# Patient Record
Sex: Male | Born: 1950
Health system: Southern US, Community
[De-identification: ages and names within clinical notes are randomized; demographics above are authoritative.]

## PROBLEM LIST (undated history)

## (undated) DIAGNOSIS — N400 Enlarged prostate without lower urinary tract symptoms: Secondary | ICD-10-CM

## (undated) DIAGNOSIS — I2510 Atherosclerotic heart disease of native coronary artery without angina pectoris: Secondary | ICD-10-CM

## (undated) DIAGNOSIS — Z8489 Family history of other specified conditions: Secondary | ICD-10-CM

## (undated) DIAGNOSIS — R112 Nausea with vomiting, unspecified: Secondary | ICD-10-CM

## (undated) DIAGNOSIS — E119 Type 2 diabetes mellitus without complications: Secondary | ICD-10-CM

## (undated) DIAGNOSIS — N200 Calculus of kidney: Secondary | ICD-10-CM

## (undated) DIAGNOSIS — G4700 Insomnia, unspecified: Secondary | ICD-10-CM

## (undated) DIAGNOSIS — H409 Unspecified glaucoma: Secondary | ICD-10-CM

## (undated) DIAGNOSIS — E7800 Pure hypercholesterolemia, unspecified: Secondary | ICD-10-CM

## (undated) DIAGNOSIS — I700 Atherosclerosis of aorta: Secondary | ICD-10-CM

## (undated) DIAGNOSIS — Z87442 Personal history of urinary calculi: Secondary | ICD-10-CM

## (undated) HISTORY — PX: URETEROSCOPY: SHX842

## (undated) HISTORY — PX: MOHS SURGERY: SUR867

## (undated) HISTORY — DX: Family history of other specified conditions: Z84.89

## (undated) HISTORY — DX: Calculus of kidney: N20.0

## (undated) HISTORY — PX: OTHER SURGICAL HISTORY: SHX169

## (undated) HISTORY — DX: Insomnia, unspecified: G47.00

## (undated) HISTORY — DX: Unspecified glaucoma: H40.9

## (undated) HISTORY — DX: Other specified postprocedural states: R11.2

## (undated) HISTORY — DX: Personal history of urinary calculi: Z87.442

## (undated) HISTORY — DX: Atherosclerotic heart disease of native coronary artery without angina pectoris: I25.10

## (undated) HISTORY — DX: Pure hypercholesterolemia, unspecified: E78.00

## (undated) HISTORY — DX: Benign prostatic hyperplasia without lower urinary tract symptoms: N40.0

## (undated) HISTORY — DX: Atherosclerosis of aorta: I70.0

---

## 2010-05-06 ENCOUNTER — Ambulatory Visit: Payer: Self-pay | Admitting: Urology

## 2010-05-19 ENCOUNTER — Ambulatory Visit: Payer: Self-pay | Admitting: Urology

## 2010-05-31 ENCOUNTER — Ambulatory Visit: Payer: Self-pay | Admitting: Urology

## 2010-05-31 DIAGNOSIS — I498 Other specified cardiac arrhythmias: Secondary | ICD-10-CM

## 2010-06-02 ENCOUNTER — Ambulatory Visit: Payer: Self-pay | Admitting: Urology

## 2010-06-06 ENCOUNTER — Ambulatory Visit: Payer: Self-pay | Admitting: Urology

## 2010-06-17 ENCOUNTER — Ambulatory Visit: Payer: Self-pay | Admitting: Urology

## 2010-06-21 ENCOUNTER — Ambulatory Visit: Payer: Self-pay | Admitting: Urology

## 2010-08-02 ENCOUNTER — Ambulatory Visit: Payer: Self-pay | Admitting: Urology

## 2010-08-26 ENCOUNTER — Ambulatory Visit: Payer: Self-pay | Admitting: Urology

## 2011-01-13 ENCOUNTER — Ambulatory Visit: Payer: Self-pay | Admitting: Urology

## 2011-01-27 ENCOUNTER — Ambulatory Visit: Payer: Self-pay | Admitting: Urology

## 2011-03-18 IMAGING — CR DG ABDOMEN 1V
1 series · 2 of 2 positions shown · non-contrast
Comparison: none

REASON FOR EXAM: nephrolithisis post litho Pt need films
COMMENTS:

[Series 1: view not recorded · 0.17mm/px · 2 of 2 slices shown]
[im 1/2]
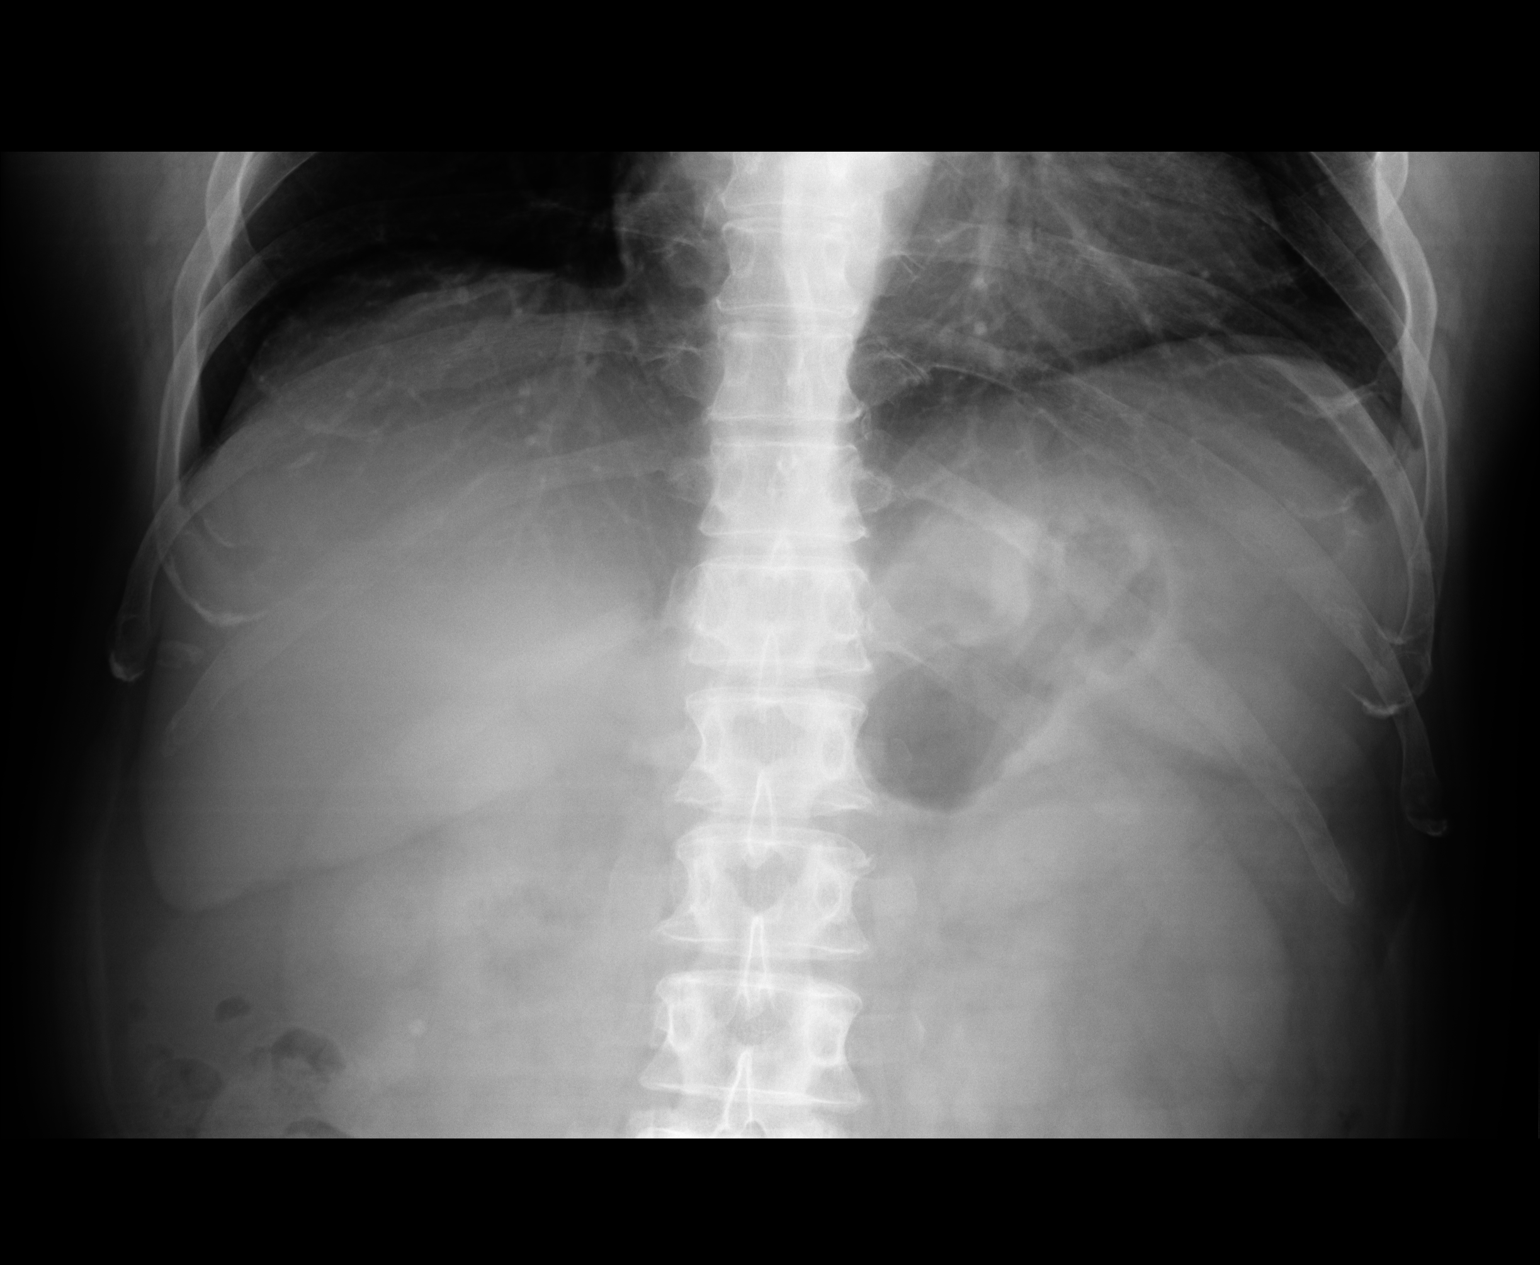
[im 2/2]
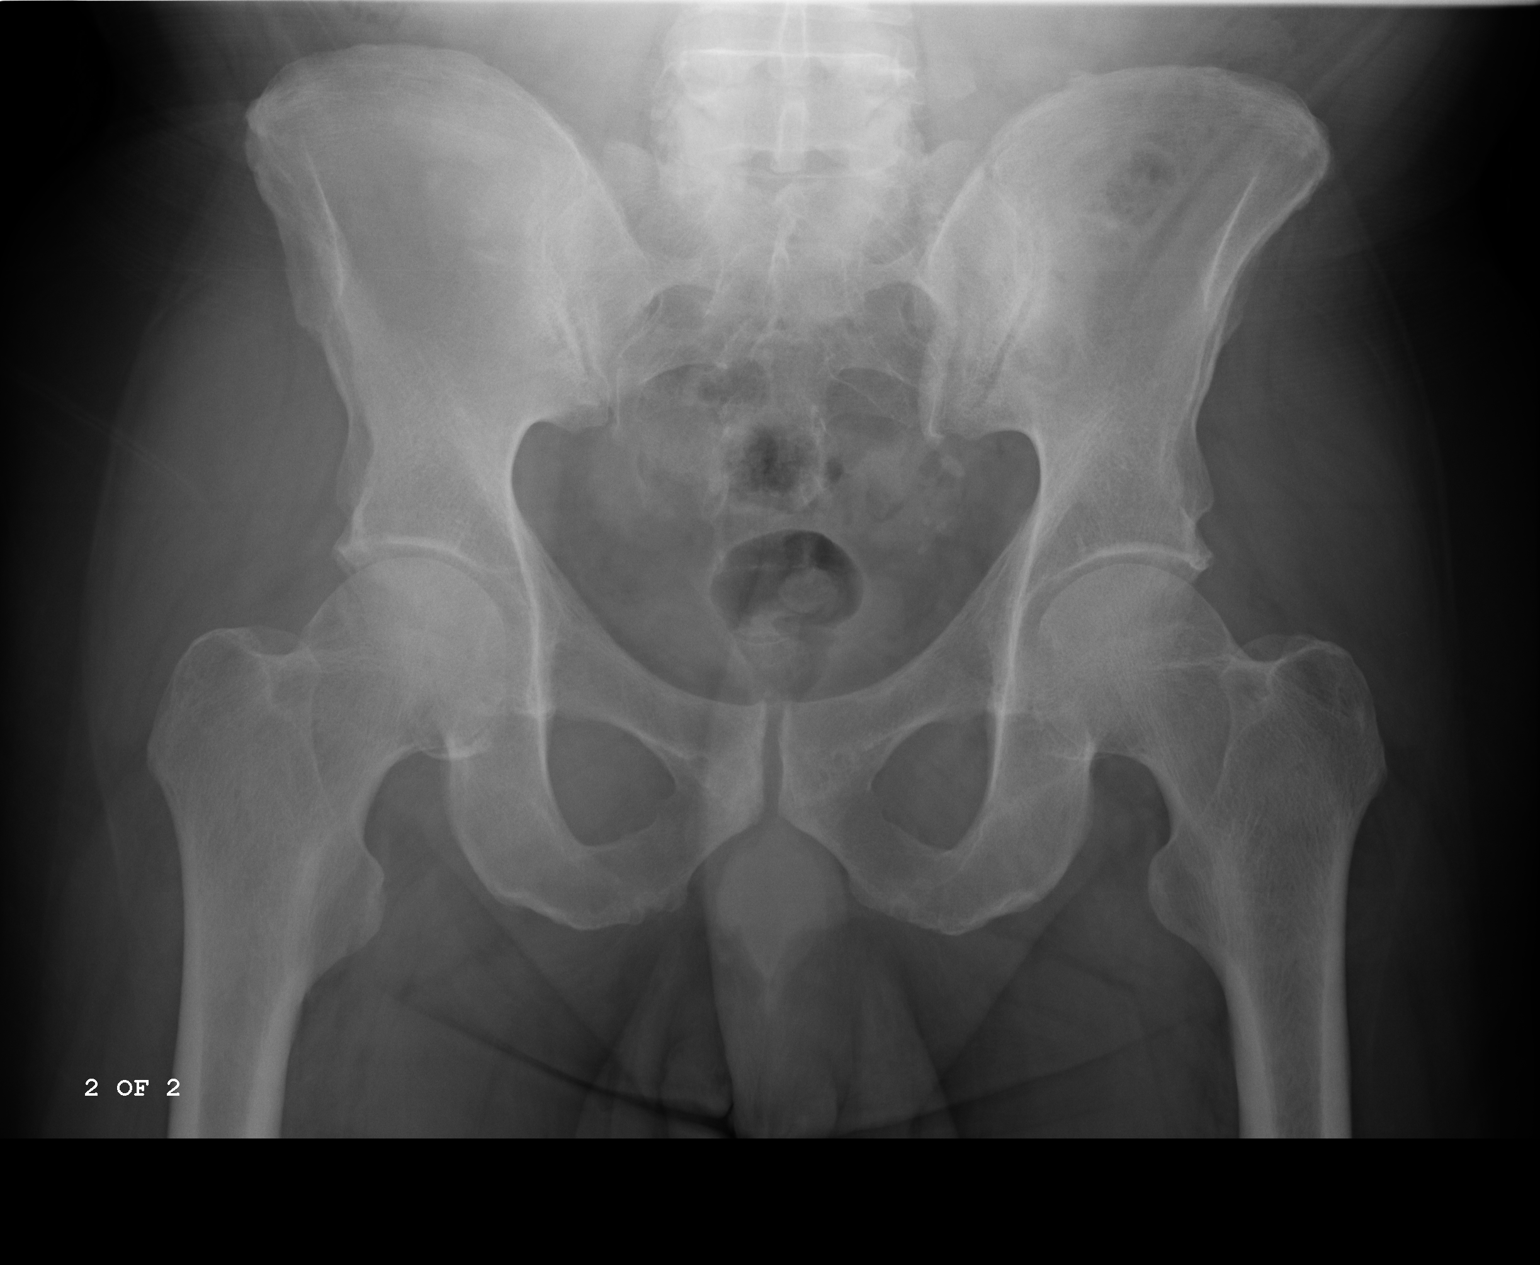

[2 of 2 positions shown; findings below may reference images not displayed]

PROCEDURE:     DXR - DXR KIDNEY URETER BLADDER  - June 17, 2010 [DATE]

RESULT:     There is increased density involving the distal left ureter
compatible with there being multiple stone fragments within. This density is
more inferior in location than was present on the exam of 06/06/2010. The
multiple calcifications at the lower pole of the left kidney noted on the
prior exam are no longer seen. There is again noted a 4 mm calcification at
the lower pole of the right kidney.
IMPRESSION: 1.  Left ureterolithiasis.
2.  Right nephrolithiasis.

## 2011-07-07 ENCOUNTER — Ambulatory Visit: Payer: Self-pay | Admitting: Urology

## 2012-08-16 ENCOUNTER — Ambulatory Visit: Payer: Self-pay | Admitting: Urology

## 2013-04-17 HISTORY — PX: EYE SURGERY: SHX253

## 2013-09-03 ENCOUNTER — Ambulatory Visit: Payer: Self-pay | Admitting: Urology

## 2014-01-15 ENCOUNTER — Ambulatory Visit: Payer: Self-pay | Admitting: Ophthalmology

## 2014-01-20 ENCOUNTER — Ambulatory Visit: Payer: Self-pay | Admitting: Ophthalmology

## 2014-02-05 ENCOUNTER — Ambulatory Visit: Payer: Self-pay | Admitting: Ophthalmology

## 2014-08-08 NOTE — Op Note (Signed)
PATIENT NAME:  Leonard Trujillo, Leonard Trujillo MR#:  [id number] DATE OF BIRTH:  10-Apr-1951  DATE OF PROCEDURE:  01/20/2014  PREOPERATIVE DIAGNOSIS: Visually significant cataract of the left eye.   POSTOPERATIVE DIAGNOSIS: Visually significant cataract of the left eye.   OPERATIVE PROCEDURE: Cataract extraction by phacoemulsification with implant of intraocular lens to left eye.   SURGEON: Birder Robson, MD.   ANESTHESIA:  1. Managed anesthesia care.  2. Topical tetracaine drops followed by 2% Xylocaine jelly applied in the preoperative holding area.   COMPLICATIONS: None.   TECHNIQUE:  Stop and chop.   DESCRIPTION OF PROCEDURE: The patient was examined and consented in the preoperative holding area where the aforementioned topical anesthesia was applied to the left eye and then brought back to the Operating Room where the left eye was prepped and draped in the usual sterile ophthalmic fashion and a lid speculum was placed. A paracentesis was created with the side port blade and the anterior chamber was filled with viscoelastic. A near clear corneal incision was performed with the steel keratome. A continuous curvilinear capsulorrhexis was performed with a cystotome followed by the capsulorrhexis forceps. Hydrodissection and hydrodelineation were carried out with BSS on a blunt cannula. The lens was removed in a stop and chop technique and the remaining cortical material was removed with the irrigation-aspiration handpiece. The capsular bag was inflated with viscoelastic and the Tecnis ZCB00   12.5-diopter lens, serial number [id number] was placed in the capsular bag without complication. The remaining viscoelastic was removed from the eye with the irrigation-aspiration handpiece. The wounds were hydrated. The anterior chamber was flushed with Miostat and the eye was inflated to physiologic pressure. 0.1 mL of cefuroxime concentration 10 mg/mL was placed in the anterior chamber. The wounds were found to be water  tight. The eye was dressed with Vigamox. The patient was given protective glasses to wear throughout the day and a shield with which to sleep tonight. The patient was also given drops with which to begin a drop regimen today and will follow-up with me in one day.    ____________________________ Livingston Diones. Ferdinand Revoir, MD wlp:JT D: 01/20/2014 19:21:49 ET T: 01/20/2014 20:17:39 ET JOB#: [id number]  cc: Beverlyn Mcginness L. Rondrick Barreira, MD, <Dictator> Livingston Diones Alexie Samson MD ELECTRONICALLY SIGNED 01/21/2014 12:12

## 2015-04-18 HISTORY — PX: OTHER SURGICAL HISTORY: SHX169

## 2015-11-05 DIAGNOSIS — N2889 Other specified disorders of kidney and ureter: Secondary | ICD-10-CM | POA: Diagnosis not present

## 2015-11-05 DIAGNOSIS — N401 Enlarged prostate with lower urinary tract symptoms: Secondary | ICD-10-CM | POA: Diagnosis not present

## 2015-11-05 DIAGNOSIS — N138 Other obstructive and reflux uropathy: Secondary | ICD-10-CM | POA: Diagnosis not present

## 2015-11-05 DIAGNOSIS — R339 Retention of urine, unspecified: Secondary | ICD-10-CM | POA: Diagnosis not present

## 2015-11-05 DIAGNOSIS — Z6824 Body mass index (BMI) 24.0-24.9, adult: Secondary | ICD-10-CM | POA: Diagnosis not present

## 2015-11-05 DIAGNOSIS — R972 Elevated prostate specific antigen [PSA]: Secondary | ICD-10-CM | POA: Diagnosis not present

## 2015-11-05 DIAGNOSIS — N200 Calculus of kidney: Secondary | ICD-10-CM | POA: Diagnosis not present

## 2015-11-10 DIAGNOSIS — E119 Type 2 diabetes mellitus without complications: Secondary | ICD-10-CM | POA: Diagnosis not present

## 2015-12-02 DIAGNOSIS — N200 Calculus of kidney: Secondary | ICD-10-CM | POA: Diagnosis not present

## 2015-12-02 DIAGNOSIS — R9431 Abnormal electrocardiogram [ECG] [EKG]: Secondary | ICD-10-CM | POA: Diagnosis not present

## 2015-12-02 DIAGNOSIS — N138 Other obstructive and reflux uropathy: Secondary | ICD-10-CM | POA: Diagnosis not present

## 2015-12-02 DIAGNOSIS — N401 Enlarged prostate with lower urinary tract symptoms: Secondary | ICD-10-CM | POA: Diagnosis not present

## 2015-12-02 DIAGNOSIS — N186 End stage renal disease: Secondary | ICD-10-CM | POA: Diagnosis not present

## 2015-12-02 DIAGNOSIS — Z6824 Body mass index (BMI) 24.0-24.9, adult: Secondary | ICD-10-CM | POA: Diagnosis not present

## 2015-12-08 DIAGNOSIS — N2889 Other specified disorders of kidney and ureter: Secondary | ICD-10-CM | POA: Diagnosis not present

## 2015-12-08 DIAGNOSIS — N200 Calculus of kidney: Secondary | ICD-10-CM | POA: Diagnosis not present

## 2015-12-08 DIAGNOSIS — I878 Other specified disorders of veins: Secondary | ICD-10-CM | POA: Diagnosis not present

## 2015-12-23 DIAGNOSIS — N200 Calculus of kidney: Secondary | ICD-10-CM | POA: Diagnosis not present

## 2016-05-09 DIAGNOSIS — H401132 Primary open-angle glaucoma, bilateral, moderate stage: Secondary | ICD-10-CM | POA: Diagnosis not present

## 2016-05-10 DIAGNOSIS — Z6824 Body mass index (BMI) 24.0-24.9, adult: Secondary | ICD-10-CM | POA: Diagnosis not present

## 2016-05-10 DIAGNOSIS — R339 Retention of urine, unspecified: Secondary | ICD-10-CM | POA: Diagnosis not present

## 2016-05-10 DIAGNOSIS — N138 Other obstructive and reflux uropathy: Secondary | ICD-10-CM | POA: Diagnosis not present

## 2016-05-10 DIAGNOSIS — R972 Elevated prostate specific antigen [PSA]: Secondary | ICD-10-CM | POA: Diagnosis not present

## 2016-05-10 DIAGNOSIS — N401 Enlarged prostate with lower urinary tract symptoms: Secondary | ICD-10-CM | POA: Diagnosis not present

## 2016-05-10 DIAGNOSIS — N200 Calculus of kidney: Secondary | ICD-10-CM | POA: Diagnosis not present

## 2016-09-12 DIAGNOSIS — H401132 Primary open-angle glaucoma, bilateral, moderate stage: Secondary | ICD-10-CM | POA: Diagnosis not present

## 2016-11-03 DIAGNOSIS — Z6824 Body mass index (BMI) 24.0-24.9, adult: Secondary | ICD-10-CM | POA: Diagnosis not present

## 2016-11-03 DIAGNOSIS — N138 Other obstructive and reflux uropathy: Secondary | ICD-10-CM | POA: Diagnosis not present

## 2016-11-03 DIAGNOSIS — R339 Retention of urine, unspecified: Secondary | ICD-10-CM | POA: Diagnosis not present

## 2016-11-03 DIAGNOSIS — R972 Elevated prostate specific antigen [PSA]: Secondary | ICD-10-CM | POA: Diagnosis not present

## 2016-11-03 DIAGNOSIS — N401 Enlarged prostate with lower urinary tract symptoms: Secondary | ICD-10-CM | POA: Diagnosis not present

## 2016-11-03 DIAGNOSIS — N200 Calculus of kidney: Secondary | ICD-10-CM | POA: Diagnosis not present

## 2017-05-07 DIAGNOSIS — H401132 Primary open-angle glaucoma, bilateral, moderate stage: Secondary | ICD-10-CM | POA: Diagnosis not present

## 2017-05-14 DIAGNOSIS — H1859 Other hereditary corneal dystrophies: Secondary | ICD-10-CM | POA: Diagnosis not present

## 2017-08-30 ENCOUNTER — Telehealth: Payer: Self-pay | Admitting: Urology

## 2017-08-30 DIAGNOSIS — N138 Other obstructive and reflux uropathy: Secondary | ICD-10-CM

## 2017-08-30 DIAGNOSIS — Z87442 Personal history of urinary calculi: Secondary | ICD-10-CM

## 2017-08-30 DIAGNOSIS — N401 Enlarged prostate with lower urinary tract symptoms: Secondary | ICD-10-CM

## 2017-08-30 NOTE — Telephone Encounter (Signed)
Patient is transferring care from [facility] from [place] and he stated that he normally gets a KUB and PSA prior to the appointment. I have sched him can you put an order in for labs and a KUB please.  Thanks you  [employer]

## 2017-08-31 NOTE — Telephone Encounter (Signed)
Orders were entered.

## 2017-11-06 ENCOUNTER — Ambulatory Visit
Admission: RE | Admit: 2017-11-06 | Discharge: 2017-11-06 | Disposition: A | Discharge status: Home / Self Care | Payer: Medicare HMO | Source: Ambulatory Visit | Attending: Urology | Admitting: Urology

## 2017-11-06 ENCOUNTER — Other Ambulatory Visit: Payer: Medicare HMO

## 2017-11-06 DIAGNOSIS — N200 Calculus of kidney: Secondary | ICD-10-CM | POA: Insufficient documentation

## 2017-11-06 DIAGNOSIS — Z87442 Personal history of urinary calculi: Secondary | ICD-10-CM | POA: Diagnosis present

## 2017-11-06 DIAGNOSIS — R972 Elevated prostate specific antigen [PSA]: Secondary | ICD-10-CM | POA: Diagnosis not present

## 2017-11-07 LAB — PSA: PROSTATE SPECIFIC AG, SERUM: 2.1 ng/mL (ref 0.0–4.0)

## 2017-11-08 ENCOUNTER — Encounter: Payer: Self-pay | Admitting: Urology

## 2017-11-08 ENCOUNTER — Ambulatory Visit: Payer: Medicare HMO | Admitting: Urology

## 2017-11-08 VITALS — BP 170/80 | HR 71 | Resp 16 | Ht 69.0 in | Wt 180.3 lb

## 2017-11-08 DIAGNOSIS — Z87442 Personal history of urinary calculi: Secondary | ICD-10-CM

## 2017-11-08 LAB — URINALYSIS, COMPLETE
Bilirubin, UA: NEGATIVE
LEUKOCYTES UA: NEGATIVE
Nitrite, UA: NEGATIVE
PROTEIN UA: NEGATIVE
RBC, UA: NEGATIVE
Specific Gravity, UA: 1.025 (ref 1.005–1.030)
Urobilinogen, Ur: 0.2 mg/dL (ref 0.2–1.0)
pH, UA: 5 (ref 5.0–7.5)

## 2017-11-08 LAB — MICROSCOPIC EXAMINATION
Epithelial Cells (non renal): NONE SEEN /hpf (ref 0–10)
WBC, UA: NONE SEEN /hpf (ref 0–5)

## 2017-11-08 NOTE — Progress Notes (Signed)
11/08/2017 10:23 AM   Leonard Trujillo 1951/04/01 [id number]  Referring provider: No referring provider defined for this encounter.  Chief Complaint  Patient presents with  . Nephrolithiasis    HPI: 67 year old male presents to establish local urologic care.  He has previously been followed by Dr. Jacqlyn Larsen in both [place] and at [facility] For recurrent stone disease and BPH.  He has had previous shockwave lithotripsy and ureteroscopic stone removal.  He developed urinary retention after ureteroscopy and 2012 and underwent PVP.  He last saw Dr. Jacqlyn Larsen in July 2018.  He was asymptomatic and KUB at that visit was unremarkable.  He denies bothersome lower urinary tract symptoms.  PSA performed earlier this month was stable at 2.1.  He denies dysuria or gross hematuria.  He has no flank, abdominal, pelvic or scrotal pain.  He is on tamsulosin and potassium citrate.  PMH: History reviewed. No pertinent past medical history.  Surgical History: . CATARACT EXTRACTION, BILATERAL october 2015  . CYSTOSCOPY W/ URETERAL STENT PLACEMENT  . EXTRACORPOREAL SHOCK WAVE LITHOTRIPSY  05/2010  . TRANSURETHRAL RESECTION OF PROSTATE  KTP laser 07/2010  . URETEROSCOPY  w/Holmium 3/12    Home Medications:  Allergies as of 11/08/2017      Reactions   Codeine Nausea And Vomiting      Medication List        Accurate as of 11/08/17 10:23 AM. Always use your most recent med list.          acetaminophen 325 MG tablet Commonly known as:  TYLENOL Take by mouth.   ALPRAZolam 0.5 MG tablet Commonly known as:  XANAX nightly as needed.   dorzolamide-timolol 22.3-6.8 MG/ML ophthalmic solution Commonly known as:  COSOPT Administer 1 drop to both eyes Two (2) times a day.   HYDROcodone-acetaminophen 5-325 MG tablet Commonly known as:  NORCO/VICODIN Take by mouth.   LUMIGAN 0.01 % Soln Generic drug:  bimatoprost 1 drop nightly.   metFORMIN 500 MG tablet Commonly known as:  GLUCOPHAGE 250 mg.  250-500mg    ondansetron 4 MG tablet Commonly known as:  ZOFRAN Take by mouth.   tamsulosin 0.4 MG Caps capsule Commonly known as:  FLOMAX Take by mouth.       Allergies:  Allergies  Allergen Reactions  . Codeine Nausea And Vomiting    Family History: History reviewed. No pertinent family history.  Social History:  has no tobacco, alcohol, and drug history on file.  ROS: UROLOGY Frequent Urination?: No Hard to postpone urination?: No Burning/pain with urination?: No Get up at night to urinate?: No Leakage of urine?: No Urine stream starts and stops?: No Trouble starting stream?: No Do you have to strain to urinate?: No Blood in urine?: No Urinary tract infection?: No Sexually transmitted disease?: No Injury to kidneys or bladder?: No Painful intercourse?: No Weak stream?: No Erection problems?: No Penile pain?: No  Gastrointestinal Nausea?: No Vomiting?: No Indigestion/heartburn?: No Diarrhea?: No Constipation?: No  Constitutional Fever: No Night sweats?: No Weight loss?: No Fatigue?: No  Skin Skin rash/lesions?: No Itching?: No  Eyes Blurred vision?: No Double vision?: No  Ears/Nose/Throat Sore throat?: No Sinus problems?: No  Hematologic/Lymphatic Swollen glands?: No Easy bruising?: No  Cardiovascular Leg swelling?: No Chest pain?: No  Respiratory Cough?: No Shortness of breath?: No  Endocrine Excessive thirst?: No  Musculoskeletal Back pain?: No Joint pain?: No  Neurological Headaches?: No Dizziness?: No  Psychologic Depression?: No Anxiety?: Yes  Physical Exam: BP (!) 170/80   Pulse 71  Resp 16   Ht 5\' 9"  (1.753 m)   Wt 180 lb 4.8 oz (81.8 kg)   SpO2 96%   BMI 26.63 kg/m   Constitutional:  Alert and oriented, No acute distress. HEENT: [place] AT, moist mucus membranes.  Trachea midline, no masses. Cardiovascular: No clubbing, cyanosis, or edema. Respiratory: Normal respiratory effort, no increased work of  breathing. GI: Abdomen is soft, nontender, nondistended, no abdominal masses GU: No CVA tenderness.  Prostate 50 g, smooth without nodules. Lymph: No cervical or inguinal lymphadenopathy. Skin: No rashes, bruises or suspicious lesions. Neurologic: Grossly intact, no focal deficits, moving all 4 extremities. Psychiatric: Normal mood and affect.   Pertinent Imaging: KUB was personally reviewed. Results for orders placed during the hospital encounter of 11/06/17  Abdomen 1 view (KUB)   Narrative CLINICAL DATA:  Nephrolithiasis  EXAM: ABDOMEN - 1 VIEW  COMPARISON:  Sep 08, 2013  FINDINGS: There is a calcification measuring 9 x 8 mm either in or overlying the upper to mid left kidney. There are nearby 1-2 mm calcifications, suspected upper pole left renal calculi. A previously noted 6 mm calcification in the lower pole left kidney is stable.  There are small vascular calcifications in the pelvis. There is moderate stool in the colon. There is no bowel obstruction or free air evident.  IMPRESSION: Stable calculus lower pole right kidney. Suspected calculi in the upper to mid left kidney, largest measuring 9 x 8 mm. Probable phleboliths in the pelvis. No bowel obstruction or free air.   Electronically Signed   By: Lowella Grip III M.D.   On: 11/06/2017 09:05     Assessment & Plan:   1.  Personal history nephrolithiasis KUB today does show faint calcifications overlying the renal outlines bilaterally.  A renal ultrasound was recommended for further evaluation.  They will be notified with results and further recommendations.  2.  BPH with lower urinary tract symptoms Stable voiding symptoms on tamsulosin.   Abbie Sons, [place] [address] [place],  [address] [phone]

## 2017-11-15 DIAGNOSIS — H401132 Primary open-angle glaucoma, bilateral, moderate stage: Secondary | ICD-10-CM | POA: Diagnosis not present

## 2017-11-16 ENCOUNTER — Ambulatory Visit
Admission: RE | Admit: 2017-11-16 | Discharge: 2017-11-16 | Disposition: A | Discharge status: Home / Self Care | Payer: Medicare HMO | Source: Ambulatory Visit | Attending: Urology | Admitting: Urology

## 2017-11-16 DIAGNOSIS — N1330 Unspecified hydronephrosis: Secondary | ICD-10-CM | POA: Insufficient documentation

## 2017-11-16 DIAGNOSIS — Z87442 Personal history of urinary calculi: Secondary | ICD-10-CM | POA: Diagnosis not present

## 2017-11-16 DIAGNOSIS — N1339 Other hydronephrosis: Secondary | ICD-10-CM | POA: Diagnosis not present

## 2017-11-18 ENCOUNTER — Other Ambulatory Visit: Payer: Self-pay | Admitting: Urology

## 2017-11-18 DIAGNOSIS — N1330 Unspecified hydronephrosis: Secondary | ICD-10-CM

## 2017-11-19 ENCOUNTER — Telehealth: Payer: Self-pay

## 2017-11-19 NOTE — Telephone Encounter (Signed)
Pt informed, please set up CT scan

## 2017-11-19 NOTE — Telephone Encounter (Signed)
Left message on vm

## 2017-11-19 NOTE — Telephone Encounter (Signed)
-----   Message from Abbie Sons, MD sent at 11/18/2017  9:09 AM EDT ----- Renal ultrasound showed mild left hydronephrosis but no calculi.  Recommend scheduling CT to evaluate for obstruction.

## 2017-11-22 DIAGNOSIS — Z125 Encounter for screening for malignant neoplasm of prostate: Secondary | ICD-10-CM | POA: Diagnosis not present

## 2017-11-22 DIAGNOSIS — E7800 Pure hypercholesterolemia, unspecified: Secondary | ICD-10-CM | POA: Diagnosis not present

## 2017-11-23 ENCOUNTER — Telehealth: Payer: Self-pay | Admitting: Urology

## 2017-11-23 NOTE — Telephone Encounter (Signed)
Spoke with pt, clarified questions about why having ct scan.

## 2017-11-23 NOTE — Telephone Encounter (Signed)
Pt is scheduled to have a CT on 11/27/17, needs instructions on what to do before his appointment and also some clarification on the procedure he is having done.

## 2017-11-27 ENCOUNTER — Ambulatory Visit
Admission: RE | Admit: 2017-11-27 | Discharge: 2017-11-27 | Disposition: A | Discharge status: Home / Self Care | Payer: Medicare HMO | Source: Ambulatory Visit | Attending: Urology | Admitting: Urology

## 2017-11-27 DIAGNOSIS — N132 Hydronephrosis with renal and ureteral calculous obstruction: Secondary | ICD-10-CM | POA: Insufficient documentation

## 2017-11-27 DIAGNOSIS — N200 Calculus of kidney: Secondary | ICD-10-CM | POA: Diagnosis not present

## 2017-11-27 DIAGNOSIS — N1330 Unspecified hydronephrosis: Secondary | ICD-10-CM | POA: Diagnosis present

## 2017-11-27 DIAGNOSIS — I700 Atherosclerosis of aorta: Secondary | ICD-10-CM | POA: Diagnosis not present

## 2017-11-27 DIAGNOSIS — K5730 Diverticulosis of large intestine without perforation or abscess without bleeding: Secondary | ICD-10-CM | POA: Insufficient documentation

## 2017-11-27 DIAGNOSIS — K4020 Bilateral inguinal hernia, without obstruction or gangrene, not specified as recurrent: Secondary | ICD-10-CM | POA: Diagnosis not present

## 2017-11-27 DIAGNOSIS — I2510 Atherosclerotic heart disease of native coronary artery without angina pectoris: Secondary | ICD-10-CM | POA: Diagnosis not present

## 2017-11-27 DIAGNOSIS — N400 Enlarged prostate without lower urinary tract symptoms: Secondary | ICD-10-CM | POA: Insufficient documentation

## 2017-11-27 LAB — POCT I-STAT CREATININE: Creatinine, Ser: 0.7 mg/dL (ref 0.61–1.24)

## 2017-11-27 MED ORDER — IOHEXOL 300 MG/ML  SOLN
100.0000 mL | Freq: Once | INTRAMUSCULAR | Status: AC | PRN
  Administered 2017-11-27: 100 mL via INTRAVENOUS

## 2017-12-03 DIAGNOSIS — Z0000 Encounter for general adult medical examination without abnormal findings: Secondary | ICD-10-CM | POA: Diagnosis not present

## 2017-12-03 DIAGNOSIS — Z125 Encounter for screening for malignant neoplasm of prostate: Secondary | ICD-10-CM | POA: Diagnosis not present

## 2017-12-03 DIAGNOSIS — R69 Illness, unspecified: Secondary | ICD-10-CM | POA: Diagnosis not present

## 2017-12-03 DIAGNOSIS — R739 Hyperglycemia, unspecified: Secondary | ICD-10-CM | POA: Diagnosis not present

## 2017-12-03 DIAGNOSIS — E7800 Pure hypercholesterolemia, unspecified: Secondary | ICD-10-CM | POA: Diagnosis not present

## 2018-03-05 DIAGNOSIS — R69 Illness, unspecified: Secondary | ICD-10-CM | POA: Diagnosis not present

## 2018-03-05 DIAGNOSIS — I700 Atherosclerosis of aorta: Secondary | ICD-10-CM | POA: Diagnosis not present

## 2018-03-05 DIAGNOSIS — E7800 Pure hypercholesterolemia, unspecified: Secondary | ICD-10-CM | POA: Diagnosis not present

## 2018-03-05 DIAGNOSIS — R739 Hyperglycemia, unspecified: Secondary | ICD-10-CM | POA: Diagnosis not present

## 2018-03-05 DIAGNOSIS — I2510 Atherosclerotic heart disease of native coronary artery without angina pectoris: Secondary | ICD-10-CM | POA: Diagnosis not present

## 2018-05-14 DIAGNOSIS — H401132 Primary open-angle glaucoma, bilateral, moderate stage: Secondary | ICD-10-CM | POA: Diagnosis not present

## 2018-05-20 DIAGNOSIS — E119 Type 2 diabetes mellitus without complications: Secondary | ICD-10-CM | POA: Diagnosis not present

## 2018-06-03 DIAGNOSIS — Z0100 Encounter for examination of eyes and vision without abnormal findings: Secondary | ICD-10-CM | POA: Diagnosis not present

## 2018-06-05 DIAGNOSIS — Z0000 Encounter for general adult medical examination without abnormal findings: Secondary | ICD-10-CM | POA: Diagnosis not present

## 2018-06-05 DIAGNOSIS — E7800 Pure hypercholesterolemia, unspecified: Secondary | ICD-10-CM | POA: Diagnosis not present

## 2018-06-05 DIAGNOSIS — R739 Hyperglycemia, unspecified: Secondary | ICD-10-CM | POA: Diagnosis not present

## 2018-06-12 DIAGNOSIS — I700 Atherosclerosis of aorta: Secondary | ICD-10-CM | POA: Diagnosis not present

## 2018-06-12 DIAGNOSIS — I2510 Atherosclerotic heart disease of native coronary artery without angina pectoris: Secondary | ICD-10-CM | POA: Diagnosis not present

## 2018-06-12 DIAGNOSIS — E7800 Pure hypercholesterolemia, unspecified: Secondary | ICD-10-CM | POA: Diagnosis not present

## 2018-06-12 DIAGNOSIS — R69 Illness, unspecified: Secondary | ICD-10-CM | POA: Diagnosis not present

## 2018-06-12 DIAGNOSIS — R739 Hyperglycemia, unspecified: Secondary | ICD-10-CM | POA: Diagnosis not present

## 2018-08-28 IMAGING — CT CT ABD-PEL WO/W CM
2 of 9 series · 13 of 46 positions shown, 15 images · IV contrast (omnipaque)
Comparison: Multiple exams, including abdominal ultrasound from
11/16/2017 and CT abdomen from 05/19/2010

CLINICAL DATA: Left hydronephrosis on ultrasound. History of renal
calculi. History of lithotripsy and renal stents.

EXAM:
CT ABDOMEN AND PELVIS WITHOUT AND WITH CONTRAST
TECHNIQUE: Multidetector CT imaging of the abdomen and pelvis was performed
following the standard protocol before and following the bolus
administration of intravenous contrast.
CONTRAST:  100mL OMNIPAQUE IOHEXOL 300 MG/ML  SOLN

[Series 2: axial st · axial · 0.80mm/px · z∈[-896,-500]mm · 10 of 97 slices shown, 12 images]
[im 9/97  soft-tissue]
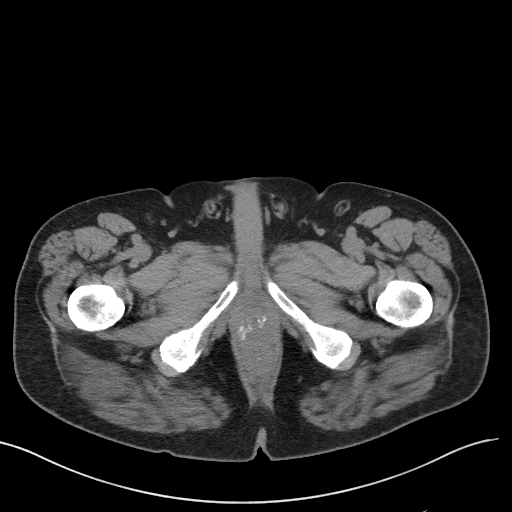
[im 9/97  bone]
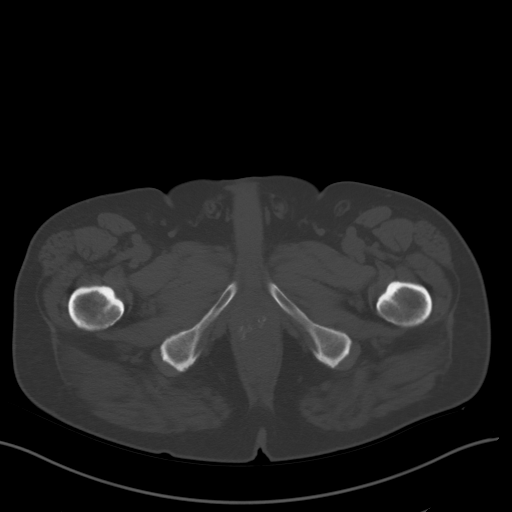
[im 18/97  soft-tissue]
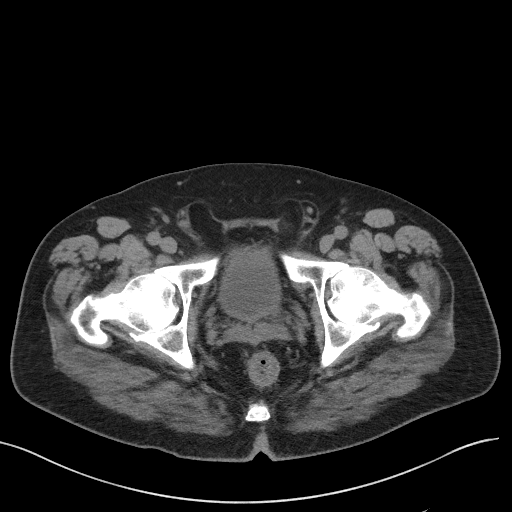
[im 27/97  soft-tissue]
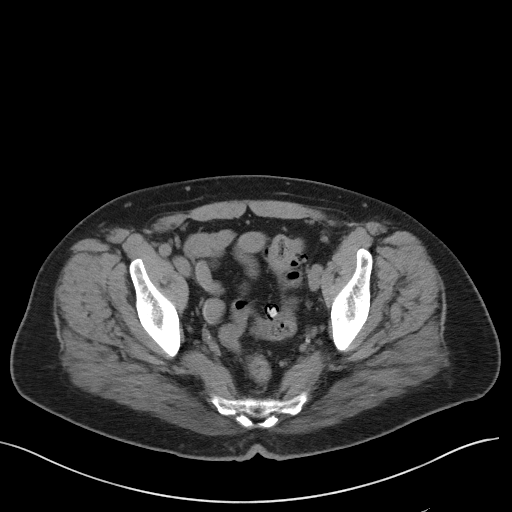
[im 35/97  soft-tissue]
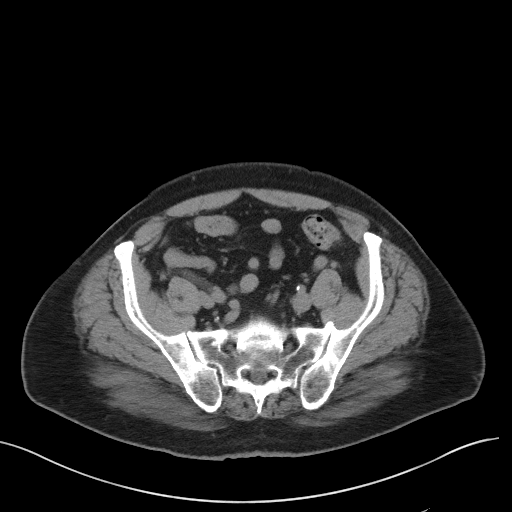
[im 44/97  soft-tissue]
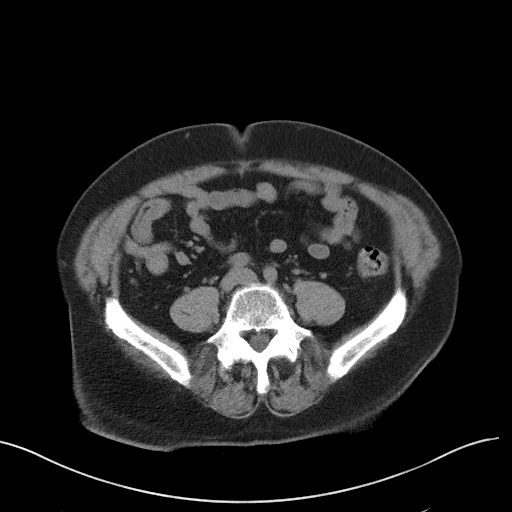
[im 53/97  soft-tissue]
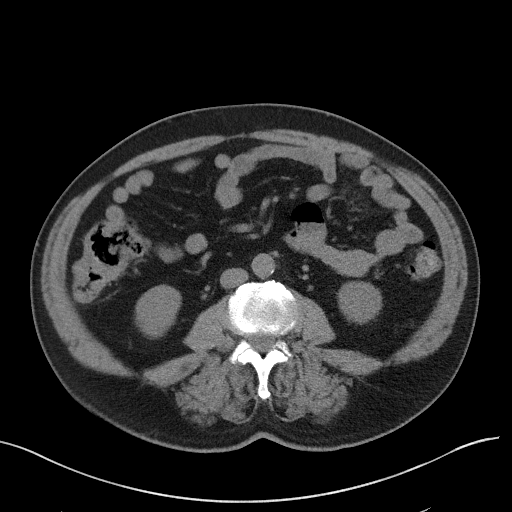
[im 62/97  soft-tissue]
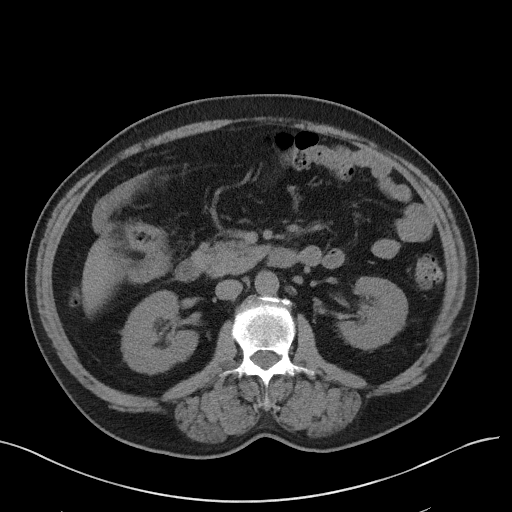
[im 70/97  soft-tissue]
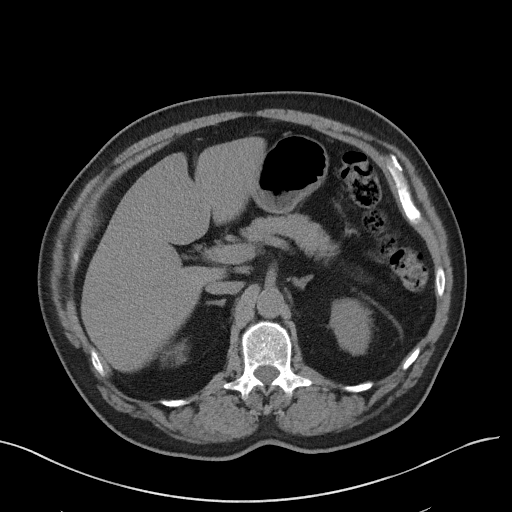
[im 79/97  soft-tissue]
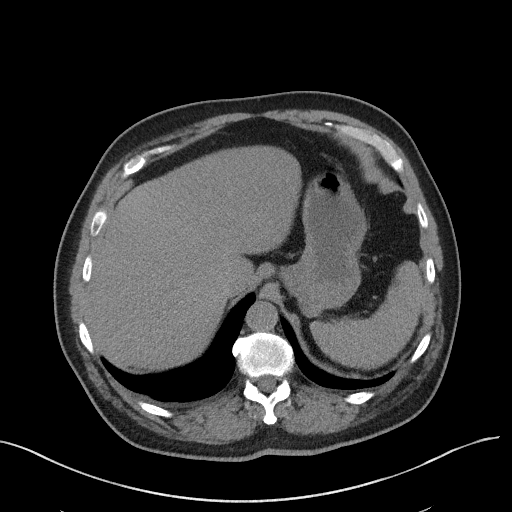
[im 79/97  bone]
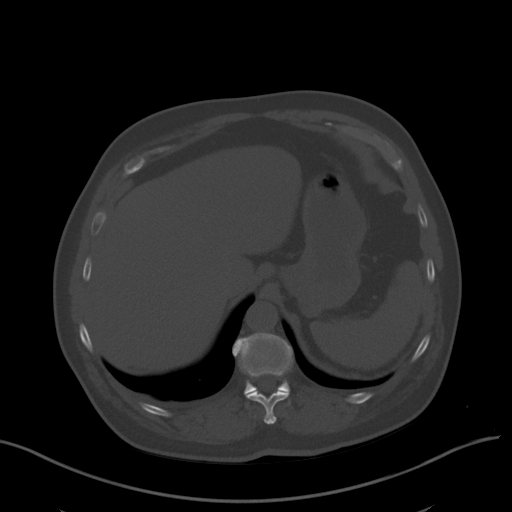
[im 88/97  soft-tissue]
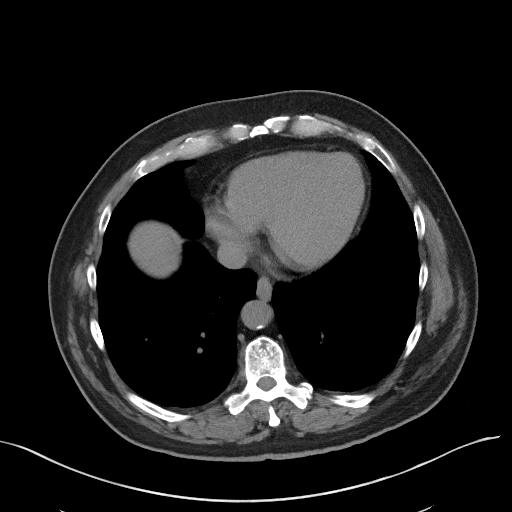

[Series 5: coronal st · coronal · 0.75mm/px · 3 of 101 slices shown]
[im 26/101  soft-tissue]
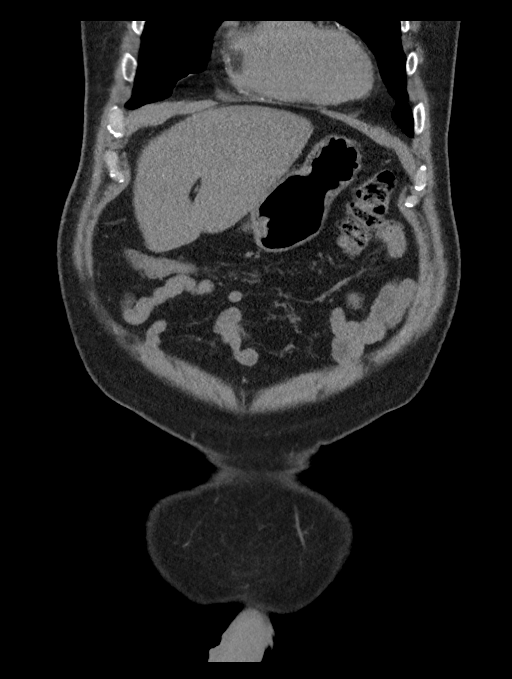
[im 51/101  soft-tissue]
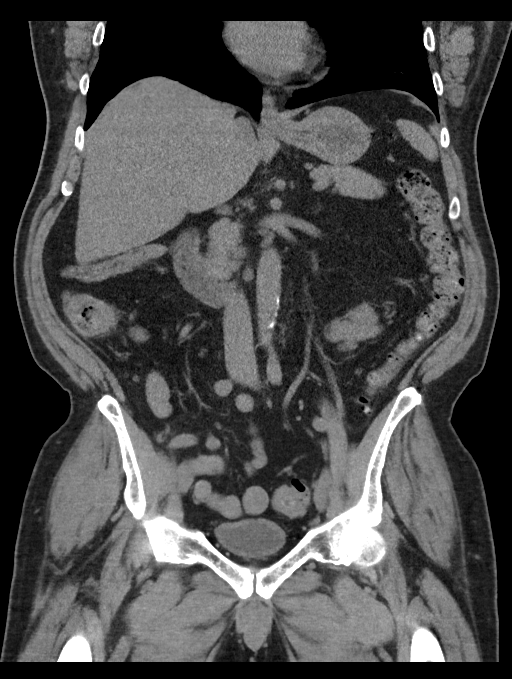
[im 76/101  soft-tissue]
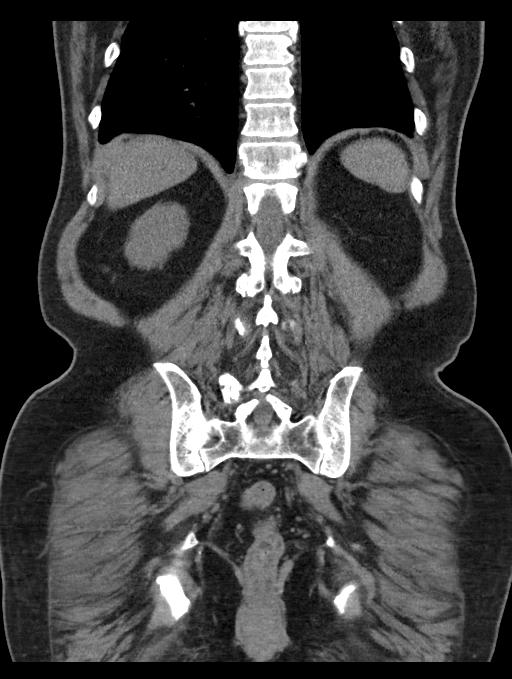

[13 of 46 positions shown; findings below may reference images not displayed]

FINDINGS: Lower chest: Three-vessel coronary artery atherosclerotic
calcification. Descending thoracic aortic atherosclerotic
calcification.

Hepatobiliary: Punctate calcification in the left hepatic lobe
favoring remote granulomatous process. Mildly contracted
gallbladder. Otherwise unremarkable.

Pancreas: Unremarkable

Spleen: Unremarkable

Adrenals/Urinary Tract: Both adrenal glands appear normal.

2 mm and adjacent 1 mm right kidney upper pole nonobstructive renal
calculi, image 73/5. 1 mm right kidney lower pole nonobstructive
renal calculus, image 65/5. 3 mm and adjacent 1 mm left mid upper
kidney nonobstructive renal calculus, image 66/5. 2 mm and separate
1 mm nonobstructive left kidney upper pole calculi, images 63-60
[DATE]. 1 mm left kidney lower pole nonobstructive renal calculus on
image 57/5. No current hydronephrosis or hydroureter. No ureteral
calculus. No significant abnormal renal parenchymal enhancement.
Urinary bladder unremarkable.

Stomach/Bowel: Several diverticula of the descending and sigmoid
colon without active diverticulitis.

Vascular/Lymphatic: Aortoiliac atherosclerotic vascular disease. No
pathologic adenopathy.

Reproductive: The prostate gland measures 4.7 by 4.2 by 4.6 cm
(volume = 48 cm^3).

Other: No supplemental non-categorized findings.

Musculoskeletal: Small bilateral direct inguinal hernias contain
adipose tissue. Central intervertebral spurring at L2-3.
IMPRESSION: 1. Bilateral nonobstructive renal calculi. No current hydronephrosis
or ureteral calculus.
2. Mild prostatomegaly.
3. Aortoiliac atherosclerotic vascular disease. Coronary
atherosclerosis.
4. Scant descending and sigmoid colon diverticula without findings
of inflammation.
5. Small bilateral direct inguinal hernias contain adipose tissue.

## 2018-11-18 DIAGNOSIS — H401132 Primary open-angle glaucoma, bilateral, moderate stage: Secondary | ICD-10-CM | POA: Diagnosis not present

## 2018-11-21 DIAGNOSIS — R7309 Other abnormal glucose: Secondary | ICD-10-CM | POA: Diagnosis not present

## 2018-11-21 DIAGNOSIS — E7800 Pure hypercholesterolemia, unspecified: Secondary | ICD-10-CM | POA: Diagnosis not present

## 2018-11-21 DIAGNOSIS — Z0000 Encounter for general adult medical examination without abnormal findings: Secondary | ICD-10-CM | POA: Diagnosis not present

## 2018-11-21 DIAGNOSIS — Z125 Encounter for screening for malignant neoplasm of prostate: Secondary | ICD-10-CM | POA: Diagnosis not present

## 2018-11-29 DIAGNOSIS — E7800 Pure hypercholesterolemia, unspecified: Secondary | ICD-10-CM | POA: Diagnosis not present

## 2018-11-29 DIAGNOSIS — R69 Illness, unspecified: Secondary | ICD-10-CM | POA: Diagnosis not present

## 2018-11-29 DIAGNOSIS — I2510 Atherosclerotic heart disease of native coronary artery without angina pectoris: Secondary | ICD-10-CM | POA: Diagnosis not present

## 2018-11-29 DIAGNOSIS — R739 Hyperglycemia, unspecified: Secondary | ICD-10-CM | POA: Diagnosis not present

## 2018-11-29 DIAGNOSIS — I700 Atherosclerosis of aorta: Secondary | ICD-10-CM | POA: Diagnosis not present

## 2019-02-25 DIAGNOSIS — E7800 Pure hypercholesterolemia, unspecified: Secondary | ICD-10-CM | POA: Diagnosis not present

## 2019-02-25 DIAGNOSIS — I2510 Atherosclerotic heart disease of native coronary artery without angina pectoris: Secondary | ICD-10-CM | POA: Diagnosis not present

## 2019-02-25 DIAGNOSIS — Z125 Encounter for screening for malignant neoplasm of prostate: Secondary | ICD-10-CM | POA: Diagnosis not present

## 2019-02-27 DIAGNOSIS — R69 Illness, unspecified: Secondary | ICD-10-CM | POA: Diagnosis not present

## 2019-03-04 DIAGNOSIS — R0989 Other specified symptoms and signs involving the circulatory and respiratory systems: Secondary | ICD-10-CM | POA: Diagnosis not present

## 2019-03-04 DIAGNOSIS — R739 Hyperglycemia, unspecified: Secondary | ICD-10-CM | POA: Diagnosis not present

## 2019-03-04 DIAGNOSIS — E7800 Pure hypercholesterolemia, unspecified: Secondary | ICD-10-CM | POA: Diagnosis not present

## 2019-03-04 DIAGNOSIS — Z0000 Encounter for general adult medical examination without abnormal findings: Secondary | ICD-10-CM | POA: Diagnosis not present

## 2019-03-05 ENCOUNTER — Other Ambulatory Visit: Payer: Self-pay

## 2019-03-12 DIAGNOSIS — R69 Illness, unspecified: Secondary | ICD-10-CM | POA: Diagnosis not present

## 2019-05-22 DIAGNOSIS — H401132 Primary open-angle glaucoma, bilateral, moderate stage: Secondary | ICD-10-CM | POA: Diagnosis not present

## 2019-05-29 DIAGNOSIS — E119 Type 2 diabetes mellitus without complications: Secondary | ICD-10-CM | POA: Diagnosis not present

## 2019-07-22 DIAGNOSIS — E7800 Pure hypercholesterolemia, unspecified: Secondary | ICD-10-CM | POA: Diagnosis not present

## 2019-07-22 DIAGNOSIS — R739 Hyperglycemia, unspecified: Secondary | ICD-10-CM | POA: Diagnosis not present

## 2019-07-29 DIAGNOSIS — I2510 Atherosclerotic heart disease of native coronary artery without angina pectoris: Secondary | ICD-10-CM | POA: Diagnosis not present

## 2019-07-29 DIAGNOSIS — E7800 Pure hypercholesterolemia, unspecified: Secondary | ICD-10-CM | POA: Diagnosis not present

## 2019-07-29 DIAGNOSIS — R69 Illness, unspecified: Secondary | ICD-10-CM | POA: Diagnosis not present

## 2019-07-29 DIAGNOSIS — R739 Hyperglycemia, unspecified: Secondary | ICD-10-CM | POA: Diagnosis not present

## 2019-08-20 DIAGNOSIS — R69 Illness, unspecified: Secondary | ICD-10-CM | POA: Diagnosis not present

## 2019-11-27 DIAGNOSIS — H26491 Other secondary cataract, right eye: Secondary | ICD-10-CM | POA: Diagnosis not present

## 2020-06-10 DIAGNOSIS — H4010X2 Unspecified open-angle glaucoma, moderate stage: Secondary | ICD-10-CM | POA: Diagnosis not present

## 2021-11-07 ENCOUNTER — Encounter: Payer: Self-pay | Admitting: *Deleted

## 2021-12-21 ENCOUNTER — Ambulatory Visit: Payer: Medicare HMO | Admitting: Urology

## 2021-12-28 ENCOUNTER — Encounter: Payer: Self-pay | Admitting: Urology

## 2021-12-28 ENCOUNTER — Ambulatory Visit: Payer: Medicare HMO | Admitting: Urology

## 2021-12-28 VITALS — BP 149/79 | HR 61 | Ht 69.0 in | Wt 150.0 lb

## 2021-12-28 DIAGNOSIS — R3129 Other microscopic hematuria: Secondary | ICD-10-CM | POA: Diagnosis not present

## 2021-12-28 DIAGNOSIS — N200 Calculus of kidney: Secondary | ICD-10-CM

## 2021-12-28 LAB — URINALYSIS, COMPLETE
Bilirubin, UA: NEGATIVE
Glucose, UA: NEGATIVE
Ketones, UA: NEGATIVE
Nitrite, UA: NEGATIVE
Specific Gravity, UA: 1.03 (ref 1.005–1.030)
Urobilinogen, Ur: 0.2 mg/dL (ref 0.2–1.0)
pH, UA: 5.5 (ref 5.0–7.5)

## 2021-12-28 LAB — MICROSCOPIC EXAMINATION

## 2021-12-28 NOTE — Progress Notes (Signed)
12/28/2021 9:18 AM   Leonard Trujillo 06-Jun-1950 [id number]  Referring provider: Adin Hector, MD [facility] [place],  [place] [address]  Chief Complaint  Patient presents with   Hematuria    HPI: 71 y.o. male referred for evaluation of gross hematuria.  Prior history of nephrolithiasis who I last saw in 2019. CT abdomen/pelvis August 2019 showed bilateral, nonobstructing renal calculi; 1-2 mm The referral was for gross hematuria however he denies gross hematuria and states recent urinalysis with microhematuria Denies flank, abdominal or pelvic pain. Urinalysis 10/25/2021 showed moderate blood on dipstick but no significant RBCs on microscopy; UA January 2023 with 4-10 RBCs and 04/2021 10-50 RBCs PSA was 2.01 No bothersome LUTS Denies flank, abdominal or pelvic pain   PMH: History reviewed. No pertinent past medical history.  Surgical History: Past Surgical History:  Procedure Laterality Date   Shockwave lithotripsy     URETEROSCOPY      Home Medications:  Allergies as of 12/28/2021       Reactions   Codeine Nausea And Vomiting        Medication List        Accurate as of December 28, 2021  9:18 AM. If you have any questions, ask your nurse or doctor.          STOP taking these medications    HYDROcodone-acetaminophen 5-325 MG tablet Commonly known as: NORCO/VICODIN Stopped by: Abbie Sons, MD   ondansetron 4 MG tablet Commonly known as: ZOFRAN Stopped by: Abbie Sons, MD   tamsulosin 0.4 MG Caps capsule Commonly known as: FLOMAX Stopped by: Abbie Sons, MD       TAKE these medications    acetaminophen 325 MG tablet Commonly known as: TYLENOL Take by mouth.   ALPRAZolam 0.5 MG tablet Commonly known as: XANAX nightly as needed.   atorvastatin 10 MG tablet Commonly known as: LIPITOR Take 1 tablet by mouth daily.   cyanocobalamin 1000 MCG tablet Commonly known as: VITAMIN B12 Take by  mouth.   dorzolamide-timolol 22.3-6.8 MG/ML ophthalmic solution Commonly known as: COSOPT Administer 1 drop to both eyes Two (2) times a day.   Lumigan 0.01 % Soln Generic drug: bimatoprost 1 drop nightly.   metFORMIN 500 MG tablet Commonly known as: GLUCOPHAGE 250 mg. 250-'500mg'$    Omega 3 1000 MG Caps 1 capsule Orally Once a day        Allergies:  Allergies  Allergen Reactions   Codeine Nausea And Vomiting    Family History: History reviewed. No pertinent family history.  Social History:  has no history on file for tobacco use, alcohol use, and drug use.   Physical Exam: BP (!) 149/79   Pulse 61   Ht '5\' 9"'$  (1.753 m)   Wt 150 lb (68 kg)   BMI 22.15 kg/m   Constitutional:  Alert and oriented, No acute distress. HEENT: [place] AT, moist mucus membranes.  Trachea midline, no masses. Cardiovascular: No clubbing, cyanosis, or edema. Respiratory: Normal respiratory effort, no increased work of breathing. GI: Abdomen is soft, nontender, nondistended, no abdominal masses GU: No CVA tenderness Skin: No rashes, bruises or suspicious lesions. Neurologic: Grossly intact, no focal deficits, moving all 4 extremities. Psychiatric: Normal mood and affect.  Laboratory Data:  Urinalysis Dipstick 2+ blood/1+ protein/1+ leukocyte Microscopy 11-30 WBC/3-10 RBC   Assessment & Plan:    1.  Microscopic hematuria UA January 2023 with significant increase RBCs at 50 per hpf AUA hematuria risk  ratification: High I recommended further evaluation with CT urogram and cystoscopy.  The procedures were discussed in detail and he has elected to proceed with further evaluation. UA today shows 3-10 RBC/11-30 WBC.  He is asymptomatic.  Urine culture was ordered  2.  Nephrolithiasis Bilateral nephrolithiasis CTU 2019; reassess with CTU as above    Abbie Sons, MD  [facility] [address], [place], [place] [address] [phone]

## 2021-12-31 LAB — CULTURE, URINE COMPREHENSIVE

## 2022-01-03 LAB — CULTURE, URINE COMPREHENSIVE

## 2022-01-04 LAB — CULTURE, URINE COMPREHENSIVE

## 2022-01-05 ENCOUNTER — Other Ambulatory Visit: Payer: Self-pay | Admitting: *Deleted

## 2022-01-05 ENCOUNTER — Telehealth: Payer: Self-pay | Admitting: *Deleted

## 2022-01-05 MED ORDER — AMOXICILLIN 875 MG PO TABS: 875.0000 mg | ORAL_TABLET | Freq: Two times a day (BID) | ORAL | 0 refills | Status: DC

## 2022-01-05 NOTE — Telephone Encounter (Signed)
-----   Message from Abbie Sons, MD sent at 01/05/2022  7:36 AM EDT ----- Urine culture was growing bacteria.  Since he was not having symptoms this may represent colonization.  Recommend keeping appointments for CT urogram and cystoscopy.  Please send in Rx amoxicillin 875 mg twice daily x7 days

## 2022-01-05 NOTE — Telephone Encounter (Signed)
Notified patient as instructed, patient pleased. Discussed follow-up appointments, patient agrees  

## 2022-01-18 ENCOUNTER — Ambulatory Visit: Payer: Medicare HMO | Admitting: Dermatology

## 2022-01-18 DIAGNOSIS — D485 Neoplasm of uncertain behavior of skin: Secondary | ICD-10-CM

## 2022-01-18 DIAGNOSIS — C44319 Basal cell carcinoma of skin of other parts of face: Secondary | ICD-10-CM

## 2022-01-18 DIAGNOSIS — L578 Other skin changes due to chronic exposure to nonionizing radiation: Secondary | ICD-10-CM | POA: Diagnosis not present

## 2022-01-18 DIAGNOSIS — C4491 Basal cell carcinoma of skin, unspecified: Secondary | ICD-10-CM

## 2022-01-18 HISTORY — DX: Basal cell carcinoma of skin, unspecified: C44.91

## 2022-01-18 NOTE — Progress Notes (Signed)
   New Patient Visit  Subjective  Leonard Trujillo is a 71 y.o. male who presents for the following: Skin Problem (Right cheek at beard line x 10-12 years. Patient hits with razor and area bleeds. ). Slowing growing.  Referral from Dr Ramonita Lab, MD.   Patient accompanied by wife.   The following portions of the chart were reviewed this encounter and updated as appropriate:       Review of Systems:  No other skin or systemic complaints except as noted in HPI or Assessment and Plan.  Objective  Well appearing patient in no apparent distress; mood and affect are within normal limits.  A focused examination was performed including face. Relevant physical exam findings are noted in the Assessment and Plan.  Right Lower Cheek above Jaw 2.1 cm pearly pink plaque with rolled border         Assessment & Plan  Actinic Damage - chronic, secondary to cumulative UV radiation exposure/sun exposure over time - diffuse erythematous macules with underlying dyspigmentation - Recommend daily broad spectrum sunscreen SPF 30+ to sun-exposed areas, reapply every 2 hours as needed.  - Recommend staying in the shade or wearing long sleeves, sun glasses (UVA+UVB protection) and wide brim hats (4-inch brim around the entire circumference of the hat). - Call for new or changing lesions.  Neoplasm of uncertain behavior of skin Right Lower Cheek above Jaw  Skin / nail biopsy Type of biopsy: tangential   Informed consent: discussed and consent obtained   Patient was prepped and draped in usual sterile fashion: Area prepped with alcohol. Anesthesia: the lesion was anesthetized in a standard fashion   Anesthetic:  1% lidocaine w/ epinephrine 1-100,000 buffered w/ 8.4% NaHCO3 Instrument used: flexible razor blade   Hemostasis achieved with: pressure, aluminum chloride and electrodesiccation   Outcome: patient tolerated procedure well   Post-procedure details: wound care instructions given    Post-procedure details comment:  Ointment and small bandage applied  Specimen 1 - Surgical pathology Differential Diagnosis: r/o BCC Check Margins: No  If positive for [facility], will refer for Mohs surgery with Dr Lacinda Axon.   Return pending biopsy.  IJamesetta Orleans, CMA, am acting as scribe for Brendolyn Patty, MD .  Documentation: I have reviewed the above documentation for accuracy and completeness, and I agree with the above.  Brendolyn Patty MD

## 2022-01-18 NOTE — Patient Instructions (Addendum)
Wound Care Instructions  Cleanse wound gently with soap and water once a day then pat dry with clean gauze. Apply a thin coat of Petrolatum (petroleum jelly, "Vaseline") over the wound (unless you have an allergy to this). We recommend that you use a new, sterile tube of Vaseline. Do not pick or remove scabs. Do not remove the yellow or white "healing tissue" from the base of the wound.  Cover the wound with fresh, clean, nonstick gauze and secure with paper tape. You may use Band-Aids in place of gauze and tape if the wound is small enough, but would recommend trimming much of the tape off as there is often too much. Sometimes Band-Aids can irritate the skin.  You should call the office for your biopsy report after 1 week if you have not already been contacted.  If you experience any problems, such as abnormal amounts of bleeding, swelling, significant bruising, significant pain, or evidence of infection, please call the office immediately.  FOR ADULT SURGERY PATIENTS: If you need something for pain relief you may take 1 extra strength Tylenol (acetaminophen) AND 2 Ibuprofen (200mg each) together every 4 hours as needed for pain. (do not take these if you are allergic to them or if you have a reason you should not take them.) Typically, you may only need pain medication for 1 to 3 days.     Due to recent changes in healthcare laws, you may see results of your pathology and/or laboratory studies on MyChart before the doctors have had a chance to review them. We understand that in some cases there may be results that are confusing or concerning to you. Please understand that not all results are received at the same time and often the doctors may need to interpret multiple results in order to provide you with the best plan of care or course of treatment. Therefore, we ask that you please give us 2 business days to thoroughly review all your results before contacting the office for clarification. Should  we see a critical lab result, you will be contacted sooner.   If You Need Anything After Your Visit  If you have any questions or concerns for your doctor, please call our main line at [phone] and press option 4 to reach your doctor's medical assistant. If no one answers, please leave a voicemail as directed and we will return your call as soon as possible. Messages left after 4 pm will be answered the following business day.   You may also send us a message via MyChart. We typically respond to MyChart messages within 1-2 business days.  For prescription refills, please ask your pharmacy to contact our office. Our fax number is [phone].  If you have an urgent issue when the clinic is closed that cannot wait until the next business day, you can page your doctor at the number below.    Please note that while we do our best to be available for urgent issues outside of office hours, we are not available 24/7.   If you have an urgent issue and are unable to reach us, you may choose to seek medical care at your doctor's office, retail clinic, urgent care center, or emergency room.  If you have a medical emergency, please immediately call 911 or go to the emergency department.  Pager Numbers  - Dr. Kowalski: [phone]  - Dr. Moye: [phone]  - Dr. Stewart: [phone]  In the event of inclement weather, please call our main line at   [phone] for an update on the status of any delays or closures.  Dermatology Medication Tips: Please keep the boxes that topical medications come in in order to help keep track of the instructions about where and how to use these. Pharmacies typically print the medication instructions only on the boxes and not directly on the medication tubes.   If your medication is too expensive, please contact our office at [phone] option 4 or send us a message through MyChart.   We are unable to tell what your co-pay for medications will be in  advance as this is different depending on your insurance coverage. However, we may be able to find a substitute medication at lower cost or fill out paperwork to get insurance to cover a needed medication.   If a prior authorization is required to get your medication covered by your insurance company, please allow us 1-2 business days to complete this process.  Drug prices often vary depending on where the prescription is filled and some pharmacies may offer cheaper prices.  The website www.goodrx.com contains coupons for medications through different pharmacies. The prices here do not account for what the cost may be with help from insurance (it may be cheaper with your insurance), but the website can give you the price if you did not use any insurance.  - You can print the associated coupon and take it with your prescription to the pharmacy.  - You may also stop by our office during regular business hours and pick up a GoodRx coupon card.  - If you need your prescription sent electronically to a different pharmacy, notify our office through [facility] MyChart or by phone at [phone] option 4.     Si Usted Necesita Algo Despus de Su Visita  Tambin puede enviarnos un mensaje a travs de MyChart. Por lo general respondemos a los mensajes de MyChart en el transcurso de 1 a 2 das hbiles.  Para renovar recetas, por favor pida a su farmacia que se ponga en contacto con nuestra oficina. Nuestro nmero de fax es el [phone].  Si tiene un asunto urgente cuando la clnica est cerrada y que no puede esperar hasta el siguiente da hbil, puede llamar/localizar a su doctor(a) al nmero que aparece a continuacin.   Por favor, tenga en cuenta que aunque hacemos todo lo posible para estar disponibles para asuntos urgentes fuera del horario de oficina, no estamos disponibles las 24 horas del da, los 7 das de la semana.   Si tiene un problema urgente y no puede comunicarse con nosotros, puede  optar por buscar atencin mdica  en el consultorio de su doctor(a), en una clnica privada, en un centro de atencin urgente o en una sala de emergencias.  Si tiene una emergencia mdica, por favor llame inmediatamente al 911 o vaya a la sala de emergencias.  Nmeros de bper  - Dr. Kowalski: [phone]  - Dra. Moye: [phone]  - Dra. Stewart: [phone]  En caso de inclemencias del tiempo, por favor llame a nuestra lnea principal al [phone] para una actualizacin sobre el estado de cualquier retraso o cierre.  Consejos para la medicacin en dermatologa: Por favor, guarde las cajas en las que vienen los medicamentos de uso tpico para ayudarle a seguir las instrucciones sobre dnde y cmo usarlos. Las farmacias generalmente imprimen las instrucciones del medicamento slo en las cajas y no directamente en los tubos del medicamento.   Si su medicamento es muy caro, por favor, pngase en contacto con   nuestra oficina llamando al [phone] y presione la opcin 4 o envenos un mensaje a travs de MyChart.   No podemos decirle cul ser su copago por los medicamentos por adelantado ya que esto es diferente dependiendo de la cobertura de su seguro. Sin embargo, es posible que podamos encontrar un medicamento sustituto a menor costo o llenar un formulario para que el seguro cubra el medicamento que se considera necesario.   Si se requiere una autorizacin previa para que su compaa de seguros cubra su medicamento, por favor permtanos de 1 a 2 das hbiles para completar este proceso.  Los precios de los medicamentos varan con frecuencia dependiendo del lugar de dnde se surte la receta y alguna farmacias pueden ofrecer precios ms baratos.  El sitio web www.goodrx.com tiene cupones para medicamentos de diferentes farmacias. Los precios aqu no tienen en cuenta lo que podra costar con la ayuda del seguro (puede ser ms barato con su seguro), pero el sitio web puede darle el  precio si no utiliz ningn seguro.  - Puede imprimir el cupn correspondiente y llevarlo con su receta a la farmacia.  - Tambin puede pasar por nuestra oficina durante el horario de atencin regular y recoger una tarjeta de cupones de GoodRx.  - Si necesita que su receta se enve electrnicamente a una farmacia diferente, informe a nuestra oficina a travs de MyChart de [facility] o por telfono llamando al [phone] y presione la opcin 4.  

## 2022-01-23 ENCOUNTER — Telehealth: Payer: Self-pay

## 2022-01-23 DIAGNOSIS — C44310 Basal cell carcinoma of skin of unspecified parts of face: Secondary | ICD-10-CM

## 2022-01-23 NOTE — Telephone Encounter (Signed)
-----   Message from Brendolyn Patty, MD sent at 01/23/2022  1:20 PM EDT ----- Skin , right lower cheek above jaw BASAL CELL CARCINOMA, NODULAR PATTERN   BCC skin cancer, needs Mohs surgery at [facility] (Dr. Lacinda Axon)  - please call patient

## 2022-01-23 NOTE — Telephone Encounter (Signed)
Advised pt of bx results.  Advised we would place referral with Dr. Lacinda Axon for mohs.

## 2022-01-24 ENCOUNTER — Ambulatory Visit
Admission: RE | Admit: 2022-01-24 | Discharge: 2022-01-24 | Disposition: A | Discharge status: Home / Self Care | Payer: Medicare HMO | Source: Ambulatory Visit | Attending: Urology | Admitting: Urology

## 2022-01-24 DIAGNOSIS — R3129 Other microscopic hematuria: Secondary | ICD-10-CM | POA: Diagnosis present

## 2022-01-24 HISTORY — DX: Type 2 diabetes mellitus without complications: E11.9

## 2022-01-24 LAB — POCT I-STAT CREATININE: Creatinine, Ser: 0.7 mg/dL (ref 0.61–1.24)

## 2022-01-24 MED ORDER — IOHEXOL 300 MG/ML  SOLN
125.0000 mL | Freq: Once | INTRAMUSCULAR | Status: AC | PRN
  Administered 2022-01-24: 125 mL via INTRAVENOUS

## 2022-01-27 ENCOUNTER — Encounter: Payer: Self-pay | Admitting: Urology

## 2022-01-27 ENCOUNTER — Ambulatory Visit: Payer: Medicare HMO | Admitting: Urology

## 2022-01-27 VITALS — BP 149/92 | HR 61 | Ht 69.0 in | Wt 150.0 lb

## 2022-01-27 DIAGNOSIS — R3129 Other microscopic hematuria: Secondary | ICD-10-CM

## 2022-01-27 DIAGNOSIS — D303 Benign neoplasm of bladder: Secondary | ICD-10-CM | POA: Diagnosis not present

## 2022-01-27 DIAGNOSIS — N200 Calculus of kidney: Secondary | ICD-10-CM

## 2022-01-27 DIAGNOSIS — N400 Enlarged prostate without lower urinary tract symptoms: Secondary | ICD-10-CM | POA: Diagnosis not present

## 2022-01-27 LAB — URINALYSIS, COMPLETE
Bilirubin, UA: NEGATIVE
Glucose, UA: NEGATIVE
Ketones, UA: NEGATIVE
Nitrite, UA: NEGATIVE
Specific Gravity, UA: 1.015 (ref 1.005–1.030)
Urobilinogen, Ur: 0.2 mg/dL (ref 0.2–1.0)
pH, UA: 6 (ref 5.0–7.5)

## 2022-01-27 LAB — MICROSCOPIC EXAMINATION: Bacteria, UA: NONE SEEN

## 2022-01-27 MED ORDER — AMOXICILLIN 875 MG PO TABS: 875.0000 mg | ORAL_TABLET | Freq: Two times a day (BID) | ORAL | 0 refills | Status: AC

## 2022-01-27 MED ORDER — FINASTERIDE 5 MG PO TABS: 5.0000 mg | ORAL_TABLET | Freq: Every day | ORAL | 12 refills | Status: DC

## 2022-01-27 NOTE — Progress Notes (Signed)
   01/27/22  CC:  Chief Complaint  Patient presents with   Cysto    HPI: Refer to my prior note 12/28/2021.  Urine culture grew Enterococcus faecalis and he was treated with amoxicillin.  Has vague lower abdominal discomfort.  No bothersome LUTS  CTU 01/24/2022 with bilateral renal calculi largest measuring 9 x 13 x 15 mm and a lower pole infundibulum extending partially into the renal pelvis.  No hydronephrosis.  Significant increase in stone burden compared with CT 2019  Prior to cystoscopy we discussed his CT results.  We discussed ureteroscopy for his stone and if he elected this option would not need to do the cystoscopy today.  He was undecided and elected to go ahead and have the cystoscopy  Blood pressure (!) 149/92, pulse 61, height '5\' 9"'$  (1.753 m), weight 150 lb (68 kg).   Cystoscopy Procedure Note  Patient identification was confirmed, informed consent was obtained, and patient was prepped using Betadine solution.  Lidocaine jelly was administered per urethral meatus.     Pre-Procedure: - Inspection reveals a normal caliber urethral meatus.  Procedure: The flexible cystoscope was introduced without difficulty - No urethral strictures/lesions are present. - Moderate adenoma regrowth with tortuosity prostatic urethra; inflammatory changes proximal prostate with hypervascularity and friability - Elevated bladder neck - Bilateral ureteral orifices identified - Bladder mucosa  reveals no ulcers, tumors, or lesions - No bladder stones - No trabeculation  Retroflexion shows backbleeding prostatic urethra   Post-Procedure: - Patient tolerated the procedure well  Assessment/ Plan: BPH with adenoma regrowth.  No bothersome LUTS.  Recommend starting finasteride 5 mg daily for size reduction and potential slowing of future prostatic growth No bladder mucosal lesions My hematuria most likely secondary to BPH and/or nephrolithiasis His left renal calculus is nonobstructing.   Stone density 1500 HU and based on stone burden and density would not recommend SWL.  Ureteroscopy with possible stage ureteroscopy was discussed in detail.  Since the stone is not obstructing observation was discussed though based on stone growth over the last 4 years he will most likely eventually need surgery.  He would like to think over these options and will call back with his decision if he elects surveillance would recommend a follow-up KUB in 4 months Urinalysis did show persistent pyuria and microhematuria.  A urine culture was ordered and he was started on amoxicillin 875 twice daily pending the culture report He inquired if his a lower abdominal discomfort is secondary to stone disease and was informed this would not be an etiology.  We discussed the most possible etiologies including BPH/prostatitis or a GI etiology    Abbie Sons, MD

## 2022-02-01 ENCOUNTER — Encounter: Payer: Self-pay | Admitting: *Deleted

## 2022-02-01 LAB — CULTURE, URINE COMPREHENSIVE

## 2022-06-13 ENCOUNTER — Telehealth: Payer: Self-pay

## 2022-06-13 NOTE — Telephone Encounter (Signed)
Updated specimen tracking and history from [facility] progress notes. aw

## 2022-11-08 ENCOUNTER — Telehealth: Payer: Self-pay | Admitting: Urology

## 2022-11-08 DIAGNOSIS — R3129 Other microscopic hematuria: Secondary | ICD-10-CM

## 2022-11-08 NOTE — Telephone Encounter (Signed)
He has a up coming appt to see Dr. Lonna Cobb for hematuria

## 2022-11-08 NOTE — Telephone Encounter (Signed)
Pt is finished w/cancer surgery and had full physical at Dr Odessa Fleming office, his pcp.  He wants to know if he needs to have KUB now and continue care w/Dr [facility]?  I scheduled a follow up.

## 2022-11-16 NOTE — Telephone Encounter (Signed)
Patient called back to ask about having KUB prior to his appt with Dr. Lonna Cobb on 11/29/22. Please advise patient.

## 2022-11-17 NOTE — Addendum Note (Signed)
Addended by: Consuella Lose on: 11/17/2022 12:03 PM   Modules accepted: Orders

## 2022-11-17 NOTE — Telephone Encounter (Signed)
Patient advised, order placed

## 2022-11-29 ENCOUNTER — Ambulatory Visit
Admission: RE | Admit: 2022-11-29 | Discharge: 2022-11-29 | Disposition: A | Discharge status: Home / Self Care | Payer: Medicare HMO | Attending: Urology | Admitting: Urology

## 2022-11-29 ENCOUNTER — Encounter: Payer: Self-pay | Admitting: Urology

## 2022-11-29 ENCOUNTER — Ambulatory Visit: Payer: Medicare HMO | Admitting: Urology

## 2022-11-29 ENCOUNTER — Ambulatory Visit
Admission: RE | Admit: 2022-11-29 | Discharge: 2022-11-29 | Disposition: A | Discharge status: Home / Self Care | Payer: Medicare HMO | Source: Ambulatory Visit | Attending: Urology | Admitting: Urology

## 2022-11-29 VITALS — BP 134/80 | HR 68 | Ht 69.0 in | Wt 140.0 lb

## 2022-11-29 DIAGNOSIS — R3129 Other microscopic hematuria: Secondary | ICD-10-CM | POA: Diagnosis not present

## 2022-11-29 DIAGNOSIS — N200 Calculus of kidney: Secondary | ICD-10-CM

## 2022-11-29 NOTE — Progress Notes (Signed)
I, Leonard Trujillo, acting as a Neurosurgeon for Leonard Altes, MD., have documented all relevant documentation on the behalf of Leonard Altes, MD, as directed by  Leonard Altes, MD while in the presence of Leonard Altes, MD.   11/29/2022 10:29 AM   Conley Rolls 1950-08-24 [id number]  Referring provider: Lynnea Ferrier, MD [address] [facility] [place],  [place] [address]  Chief Complaint  Patient presents with   Nephrolithiasis    HPI: 72 y.o. male presents for follow up visit.  History of recurrent stone disease and seen for gross hematuria September 2023. CT urogram remarkable for a 13 x 9 x 15 mm non-obstructing infundibular stone, approaching the left renal pelvis; cystoscopy remarkable for BPH with adenoma regrowth. He elected observation, however, has had delay in follow up due to extensive Mohs surgery of the face, requiring flap reconstruction. Has occasional twinges of left flank pain, but no renal colic. No gross hematuria.   PMH: Past Medical History:  Diagnosis Date   Basal cell carcinoma 01/18/2022   Right Lower Cheek above Jaw, MOHs 06/06/22   Diabetes mellitus without complication [facility])     Surgical History: Past Surgical History:  Procedure Laterality Date   Shockwave lithotripsy     URETEROSCOPY      Home Medications:  Allergies as of 11/29/2022       Reactions   Fentanyl Nausea Only   Pioglitazone Other (See Comments)   Codeine Nausea And Vomiting        Medication List        Accurate as of November 29, 2022 10:29 AM. If you have any questions, ask your nurse or doctor.          acetaminophen 325 MG tablet Commonly known as: TYLENOL Take by mouth.   ALPRAZolam 0.5 MG tablet Commonly known as: XANAX nightly as needed.   atorvastatin 10 MG tablet Commonly known as: LIPITOR Take 1 tablet by mouth daily.   cyanocobalamin 1000 MCG tablet Commonly known as: VITAMIN B12 Take by mouth.   dorzolamide-timolol  2-0.5 % ophthalmic solution Commonly known as: COSOPT Administer 1 drop to both eyes Two (2) times a day.   finasteride 5 MG tablet Commonly known as: PROSCAR Take 1 tablet (5 mg total) by mouth daily.   Lumigan 0.01 % Soln Generic drug: bimatoprost 1 drop nightly.   metFORMIN 500 MG tablet Commonly known as: GLUCOPHAGE 250 mg. 250-500mg    Omega 3 1000 MG Caps 1 capsule Orally Once a day        Allergies:  Allergies  Allergen Reactions   Fentanyl Nausea Only   Pioglitazone Other (See Comments)   Codeine Nausea And Vomiting    Social History:  reports that he has never smoked. He has quit using smokeless tobacco. No history on file for alcohol use and drug use.   Physical Exam: BP 134/80   Pulse 68   Ht 5\' 9"  (1.753 m)   Wt 140 lb (63.5 kg)   BMI 20.67 kg/m   Constitutional:  Alert and oriented, No acute distress. HEENT: [place] AT Respiratory: Normal respiratory effort, no increased work of breathing. GI: Abdomen is soft, nontender, nondistended, no abdominal masses Skin: No rashes, bruises or suspicious lesions. Neurologic: Grossly intact, no focal deficits, moving all 4 extremities. Psychiatric: Normal mood and affect.   Pertinent Imaging:  KUB performed today was personally reviewed and interpreted, and there has been interval stone growth with large left  infundibular stone, measuring 11 x 20 mm. Lower calyceal calculi are noted.    Assessment & Plan:    1.  1. Left nephrolithasis Significant interval stone growth. I have recommended treatment and we discussed options of staged ureteroscopy vs. PCNL. He has elected stage ureteroscopy. The procedure was discussed in detail including potential risks of bleeding, infection, and injury to the kidney. The need for a stent post-operatively and the possibility of stent symptoms was discussed. All questions were answered and he desires to schedule.   I have reviewed the above documentation for accuracy and  completeness, and I agree with the above.   Leonard Altes, MD  [facility] [address], [address] [place], [place] [address] [phone]

## 2022-11-29 NOTE — H&P (View-Only) (Signed)
I, [name], acting as a Neurosurgeon for Leonard Altes, MD., have documented all relevant documentation on the behalf of Leonard Altes, MD, as directed by  Leonard Altes, MD while in the presence of Leonard Altes, MD.   11/29/2022 10:29 AM   Leonard Trujillo 1950-08-24 [id number]  Referring provider: Lynnea Ferrier, MD [address] [facility] [place],  [place] [address]  Chief Complaint  Patient presents with   Nephrolithiasis    HPI: 72 y.o. male presents for follow up visit.  History of recurrent stone disease and seen for gross hematuria September 2023. CT urogram remarkable for a 13 x 9 x 15 mm non-obstructing infundibular stone, approaching the left renal pelvis; cystoscopy remarkable for BPH with adenoma regrowth. He elected observation, however, has had delay in follow up due to extensive Mohs surgery of the face, requiring flap reconstruction. Has occasional twinges of left flank pain, but no renal colic. No gross hematuria.   PMH: Past Medical History:  Diagnosis Date   Basal cell carcinoma 01/18/2022   Right Lower Cheek above Jaw, MOHs 06/06/22   Diabetes mellitus without complication [facility])     Surgical History: Past Surgical History:  Procedure Laterality Date   Shockwave lithotripsy     URETEROSCOPY      Home Medications:  Allergies as of 11/29/2022       Reactions   Fentanyl Nausea Only   Pioglitazone Other (See Comments)   Codeine Nausea And Vomiting        Medication List        Accurate as of November 29, 2022 10:29 AM. If you have any questions, ask your nurse or doctor.          acetaminophen 325 MG tablet Commonly known as: TYLENOL Take by mouth.   ALPRAZolam 0.5 MG tablet Commonly known as: XANAX nightly as needed.   atorvastatin 10 MG tablet Commonly known as: LIPITOR Take 1 tablet by mouth daily.   cyanocobalamin 1000 MCG tablet Commonly known as: VITAMIN B12 Take by mouth.   dorzolamide-timolol  2-0.5 % ophthalmic solution Commonly known as: COSOPT Administer 1 drop to both eyes Two (2) times a day.   finasteride 5 MG tablet Commonly known as: PROSCAR Take 1 tablet (5 mg total) by mouth daily.   Lumigan 0.01 % Soln Generic drug: bimatoprost 1 drop nightly.   metFORMIN 500 MG tablet Commonly known as: GLUCOPHAGE 250 mg. 250-500mg    Omega 3 1000 MG Caps 1 capsule Orally Once a day        Allergies:  Allergies  Allergen Reactions   Fentanyl Nausea Only   Pioglitazone Other (See Comments)   Codeine Nausea And Vomiting    Social History:  reports that he has never smoked. He has quit using smokeless tobacco. No history on file for alcohol use and drug use.   Physical Exam: BP 134/80   Pulse 68   Ht 5\' 9"  (1.753 m)   Wt 140 lb (63.5 kg)   BMI 20.67 kg/m   Constitutional:  Alert and oriented, No acute distress. HEENT: [place] AT Respiratory: Normal respiratory effort, no increased work of breathing. GI: Abdomen is soft, nontender, nondistended, no abdominal masses Skin: No rashes, bruises or suspicious lesions. Neurologic: Grossly intact, no focal deficits, moving all 4 extremities. Psychiatric: Normal mood and affect.   Pertinent Imaging:  KUB performed today was personally reviewed and interpreted, and there has been interval stone growth with large left  infundibular stone, measuring 11 x 20 mm. Lower calyceal calculi are noted.    Assessment & Plan:    1.  1. Left nephrolithasis Significant interval stone growth. I have recommended treatment and we discussed options of staged ureteroscopy vs. PCNL. He has elected stage ureteroscopy. The procedure was discussed in detail including potential risks of bleeding, infection, and injury to the kidney. The need for a stent post-operatively and the possibility of stent symptoms was discussed. All questions were answered and he desires to schedule.   I have reviewed the above documentation for accuracy and  completeness, and I agree with the above.   Leonard Altes, MD  [facility] [address], [address] [place], [place] [address] [phone]

## 2022-11-30 ENCOUNTER — Other Ambulatory Visit: Payer: Self-pay

## 2022-11-30 DIAGNOSIS — N200 Calculus of kidney: Secondary | ICD-10-CM

## 2022-11-30 NOTE — Progress Notes (Signed)
Surgical Physician Order Form [facility]  Dr. Irineo Axon, MD  * Scheduling expectation : Next Available  *Length of Case: 2 hours  *Clearance needed: no  *Anticoagulation Instructions: N/A  *Aspirin Instructions: N/A  *Post-op visit Date/Instructions:  TBD  *Diagnosis: Left Nephrolithiasis  *Procedure: Left Ureteroscopy w/laser lithotripsy & stent placement ([address]); staged   Additional orders: N/A  -Admit type: OUTpatient  -Anesthesia: General  -VTE Prophylaxis Standing Order SCD's       Other:   -Standing Lab Orders Per Anesthesia    Lab other: UA&Urine Culture  -Standing Test orders EKG/Chest x-ray per Anesthesia       Test other:   - Medications:  Ceftriaxone(Rocephin) 1gm IV  -Other orders:  N/A

## 2022-12-01 ENCOUNTER — Telehealth: Payer: Self-pay

## 2022-12-01 NOTE — Telephone Encounter (Signed)
Per Dr. Lonna Cobb, Patient is to be scheduled for Left Ureteroscopy with Laser Lithotripsy and Stent Placement   Leonard Trujillo was contacted and possible surgical dates were discussed, Tuesday September 10th, 2024 was agreed upon for surgery.   Patient was instructed that Dr. Lonna Cobb will require them to provide a pre-op UA & CX prior to surgery. This was ordered and scheduled drop off appointment was made for 12/13/2022.    Patient was directed to call [phone] between 1-3pm the day before surgery to find out surgical arrival time.  Instructions were given not to eat or drink from midnight on the night before surgery and have a driver for the day of surgery. On the surgery day patient was instructed to enter through the [facility] entrance of [facility] report the Same Day Surgery desk.   Pre-Admit Testing will be in contact via phone to set up an interview with the anesthesia team to review your history and medications prior to surgery.   Reminder of this information was sent via MyChart to the patient.

## 2022-12-01 NOTE — Progress Notes (Signed)
    [facility] Posting Form  Surgery Date: Date: 12/26/2022  Surgeon: Dr. Irineo Axon, MD  Inpt ( No  )   Outpt (Yes)   Obs ( No  )   Diagnosis: N20.0 Left Nephrolithiasis   -CPT: 19147  Surgery: Left Ureteroscopy with Laser Lithotripsy and Stent Placement  Stop Anticoagulations: N/A  Cardiac/Medical/Pulmonary Clearance needed: no  *Orders entered into EPIC  Date: 12/01/22   *Case booked in [place]  Date: 12/01/22  *Notified pt of Surgery: Date: 12/01/22  PRE-OP UA & CX: yes, will obtain in clinic on 12/13/2022  *Placed into Prior Authorization Work Angela Nevin Date: 12/01/22  Assistant/laser/rep:No

## 2022-12-06 ENCOUNTER — Other Ambulatory Visit: Payer: Medicare HMO

## 2022-12-13 ENCOUNTER — Other Ambulatory Visit: Payer: Medicare HMO

## 2022-12-13 DIAGNOSIS — N200 Calculus of kidney: Secondary | ICD-10-CM

## 2022-12-13 LAB — URINALYSIS, COMPLETE
Bilirubin, UA: NEGATIVE
Glucose, UA: NEGATIVE
Ketones, UA: NEGATIVE
Nitrite, UA: NEGATIVE
Specific Gravity, UA: >1.03 — ABNORMAL HIGH (ref 1.005–1.030)
Urobilinogen, Ur: 0.2 mg/dL (ref 0.2–1.0)
pH, UA: 5.5 (ref 5.0–7.5)

## 2022-12-13 LAB — MICROSCOPIC EXAMINATION

## 2022-12-16 LAB — CULTURE, URINE COMPREHENSIVE

## 2022-12-20 ENCOUNTER — Other Ambulatory Visit: Payer: Self-pay

## 2022-12-20 ENCOUNTER — Encounter
Admission: RE | Admit: 2022-12-20 | Discharge: 2022-12-20 | Disposition: A | Discharge status: Home / Self Care | Payer: Medicare HMO | Source: Ambulatory Visit | Attending: Urology | Admitting: Urology

## 2022-12-20 DIAGNOSIS — Z01812 Encounter for preprocedural laboratory examination: Secondary | ICD-10-CM

## 2022-12-20 DIAGNOSIS — E119 Type 2 diabetes mellitus without complications: Secondary | ICD-10-CM

## 2022-12-20 NOTE — Patient Instructions (Addendum)
Your procedure is scheduled on: 12/26/22 - Tuesday Report to the Registration Desk on the 1st floor of the Medical Mall. To find out your arrival time, please call [phone] between 1PM - 3PM on: 12/25/22 - Monday If your arrival time is 6:00 am, do not arrive before that time as the Medical Mall entrance doors do not open until 6:00 am.  REMEMBER: Instructions that are not followed completely may result in serious medical risk, up to and including death; or upon the discretion of your surgeon and anesthesiologist your surgery may need to be rescheduled.  Do not eat food or drink any liquids after midnight the night before surgery.  No gum chewing or hard candies.   One week prior to surgery: Stop Anti-inflammatories (NSAIDS) such as Advil, Aleve, Ibuprofen, Motrin, Naproxen, Naprosyn and Aspirin based products such as Excedrin, Goody's Powder, BC Powder. You may however, continue to take Tylenol if needed for pain up until the day of surgery.  Stop ANY OVER THE COUNTER supplements until after surgery.   TAKE ONLY THESE MEDICATIONS THE MORNING OF SURGERY WITH A SIP OF WATER:  dorzolamide-timolol (COSOPT)  finasteride (PROSCAR)    HOLD metFORMIN (GLUCOPHAGE)  starting 12/24/22.   No Alcohol for 24 hours before or after surgery.  No Smoking including e-cigarettes for 24 hours before surgery.  No chewable tobacco products for at least 6 hours before surgery.  No nicotine patches on the day of surgery.  Do not use any "recreational" drugs for at least a week (preferably 2 weeks) before your surgery.  Please be advised that the combination of cocaine and anesthesia may have negative outcomes, up to and including death. If you test positive for cocaine, your surgery will be cancelled.  On the morning of surgery brush your teeth with toothpaste and water, you may rinse your mouth with mouthwash if you wish. Do not swallow any toothpaste or mouthwash.  Do not wear jewelry,  make-up, hairpins, clips or nail polish.  Do not wear lotions, powders, or perfumes.   Do not shave body hair from the neck down 48 hours before surgery.  Contact lenses, hearing aids and dentures may not be worn into surgery.  Do not bring valuables to the hospital. [facility] is not responsible for any missing/lost belongings or valuables.   Notify your doctor if there is any change in your medical condition (cold, fever, infection).  Wear comfortable clothing (specific to your surgery type) to the hospital.  After surgery, you can help prevent lung complications by doing breathing exercises.  Take deep breaths and cough every 1-2 hours. Your doctor may order a device called an Incentive Spirometer to help you take deep breaths. When coughing or sneezing, hold a pillow firmly against your incision with both hands. This is called "splinting." Doing this helps protect your incision. It also decreases belly discomfort.  If you are being admitted to the hospital overnight, leave your suitcase in the car. After surgery it may be brought to your room.  In case of increased patient census, it may be necessary for you, the patient, to continue your postoperative care in the Same Day Surgery department.  If you are being discharged the day of surgery, you will not be allowed to drive home. You will need a responsible individual to drive you home and stay with you for 24 hours after surgery.   If you are taking public transportation, you will need to have a responsible individual with you.  Please call  the Pre-admissions Testing Dept. at [phone] if you have any questions about these instructions.  Surgery Visitation Policy:  Patients having surgery or a procedure may have two visitors.  Children under the age of 16 must have an adult with them who is not the patient.  Inpatient Visitation:    Visiting hours are 7 a.m. to 8 p.m. Up to four visitors are allowed at one time in a  patient room. The visitors may rotate out with other people during the day.  One visitor age 47 or older may stay with the patient overnight and must be in the room by 8 p.m.

## 2022-12-21 ENCOUNTER — Encounter
Admission: RE | Admit: 2022-12-21 | Discharge: 2022-12-21 | Disposition: A | Discharge status: Home / Self Care | Payer: Medicare HMO | Source: Ambulatory Visit | Attending: Urology | Admitting: Urology

## 2022-12-21 ENCOUNTER — Encounter: Payer: Self-pay | Admitting: Urgent Care

## 2022-12-21 DIAGNOSIS — Z01810 Encounter for preprocedural cardiovascular examination: Secondary | ICD-10-CM | POA: Diagnosis not present

## 2022-12-21 DIAGNOSIS — E119 Type 2 diabetes mellitus without complications: Secondary | ICD-10-CM | POA: Insufficient documentation

## 2022-12-25 MED ORDER — SODIUM CHLORIDE 0.9 % IV SOLN
1.0000 g | INTRAVENOUS | Status: AC
  Administered 2022-12-26: 1 g via INTRAVENOUS
  Filled 2022-12-25: qty 10

## 2022-12-25 MED ORDER — ORAL CARE MOUTH RINSE: 15.0000 mL | Freq: Once | OROMUCOSAL | Status: AC

## 2022-12-25 MED ORDER — FAMOTIDINE 20 MG PO TABS
20.0000 mg | ORAL_TABLET | Freq: Once | ORAL | Status: AC
  Administered 2022-12-26: 20 mg via ORAL

## 2022-12-25 MED ORDER — CHLORHEXIDINE GLUCONATE 0.12 % MT SOLN
15.0000 mL | Freq: Once | OROMUCOSAL | Status: AC
  Administered 2022-12-26: 15 mL via OROMUCOSAL

## 2022-12-25 MED ORDER — SODIUM CHLORIDE 0.9 % IV SOLN: INTRAVENOUS | Status: DC

## 2022-12-25 NOTE — Group Note (Deleted)

## 2022-12-26 ENCOUNTER — Ambulatory Visit: Payer: Medicare HMO | Admitting: Urgent Care

## 2022-12-26 ENCOUNTER — Encounter: Payer: Self-pay | Admitting: Urology

## 2022-12-26 ENCOUNTER — Encounter
Admission: RE | Disposition: A | Discharge status: Home / Self Care | Payer: Self-pay | Source: Home / Self Care | Attending: Urology

## 2022-12-26 ENCOUNTER — Ambulatory Visit
Admission: RE | Admit: 2022-12-26 | Discharge: 2022-12-26 | Disposition: A | Discharge status: Home / Self Care | Payer: Medicare HMO | Attending: Urology | Admitting: Urology

## 2022-12-26 ENCOUNTER — Other Ambulatory Visit: Payer: Self-pay

## 2022-12-26 ENCOUNTER — Ambulatory Visit: Payer: Medicare HMO | Admitting: Certified Registered"

## 2022-12-26 ENCOUNTER — Ambulatory Visit: Payer: Medicare HMO

## 2022-12-26 DIAGNOSIS — I2510 Atherosclerotic heart disease of native coronary artery without angina pectoris: Secondary | ICD-10-CM | POA: Diagnosis not present

## 2022-12-26 DIAGNOSIS — Z7984 Long term (current) use of oral hypoglycemic drugs: Secondary | ICD-10-CM | POA: Insufficient documentation

## 2022-12-26 DIAGNOSIS — N200 Calculus of kidney: Secondary | ICD-10-CM | POA: Diagnosis present

## 2022-12-26 DIAGNOSIS — N400 Enlarged prostate without lower urinary tract symptoms: Secondary | ICD-10-CM | POA: Insufficient documentation

## 2022-12-26 DIAGNOSIS — E119 Type 2 diabetes mellitus without complications: Secondary | ICD-10-CM | POA: Insufficient documentation

## 2022-12-26 HISTORY — PX: CYSTOSCOPY/URETEROSCOPY/HOLMIUM LASER/STENT PLACEMENT: SHX6546

## 2022-12-26 LAB — GLUCOSE, CAPILLARY
Glucose-Capillary: 107 mg/dL — ABNORMAL HIGH (ref 70–99)
Glucose-Capillary: 119 mg/dL — ABNORMAL HIGH (ref 70–99)

## 2022-12-26 SURGERY — CYSTOSCOPY/URETEROSCOPY/HOLMIUM LASER/STENT PLACEMENT
Anesthesia: General | Site: Ureter | Laterality: Left

## 2022-12-26 MED ORDER — OXYBUTYNIN CHLORIDE 5 MG PO TABS
ORAL_TABLET | ORAL | Status: AC
  Filled 2022-12-26: qty 1

## 2022-12-26 MED ORDER — PROPOFOL 10 MG/ML IV BOLUS
INTRAVENOUS | Status: DC | PRN
  Administered 2022-12-26: 150 mg via INTRAVENOUS
  Administered 2022-12-26: 125 ug/kg/min via INTRAVENOUS

## 2022-12-26 MED ORDER — ACETAMINOPHEN 10 MG/ML IV SOLN
INTRAVENOUS | Status: DC | PRN
  Administered 2022-12-26: 1000 mg via INTRAVENOUS

## 2022-12-26 MED ORDER — FAMOTIDINE 20 MG PO TABS
ORAL_TABLET | ORAL | Status: AC
  Filled 2022-12-26: qty 1

## 2022-12-26 MED ORDER — PROPOFOL 1000 MG/100ML IV EMUL
INTRAVENOUS | Status: AC
  Filled 2022-12-26: qty 100

## 2022-12-26 MED ORDER — IOHEXOL 180 MG/ML  SOLN
INTRAMUSCULAR | Status: DC | PRN
  Administered 2022-12-26: 20 mL

## 2022-12-26 MED ORDER — DEXAMETHASONE SODIUM PHOSPHATE 10 MG/ML IJ SOLN
INTRAMUSCULAR | Status: DC | PRN
  Administered 2022-12-26: 10 mg via INTRAVENOUS

## 2022-12-26 MED ORDER — FENTANYL CITRATE (PF) 100 MCG/2ML IJ SOLN: 25.0000 ug | INTRAMUSCULAR | Status: DC | PRN

## 2022-12-26 MED ORDER — ONDANSETRON HCL 4 MG PO TABS: 4.0000 mg | ORAL_TABLET | Freq: Three times a day (TID) | ORAL | 0 refills | Status: DC | PRN

## 2022-12-26 MED ORDER — OXYBUTYNIN CHLORIDE 5 MG PO TABS
5.0000 mg | ORAL_TABLET | Freq: Three times a day (TID) | ORAL | Status: DC | PRN
  Administered 2022-12-26: 5 mg via ORAL

## 2022-12-26 MED ORDER — SODIUM CHLORIDE 0.9 % IR SOLN
Status: DC | PRN
  Administered 2022-12-26: 3000 mL

## 2022-12-26 MED ORDER — TAMSULOSIN HCL 0.4 MG PO CAPS: 0.4000 mg | ORAL_CAPSULE | Freq: Every day | ORAL | 0 refills | Status: DC

## 2022-12-26 MED ORDER — STERILE WATER FOR IRRIGATION IR SOLN
Status: DC | PRN
Start: 2022-12-26 — End: 2022-12-26
  Administered 2022-12-26: 500 mL

## 2022-12-26 MED ORDER — OXYCODONE HCL 5 MG/5ML PO SOLN: 5.0000 mg | Freq: Once | ORAL | Status: DC | PRN

## 2022-12-26 MED ORDER — PHENYLEPHRINE HCL (PRESSORS) 10 MG/ML IV SOLN
INTRAVENOUS | Status: DC | PRN
  Administered 2022-12-26: 80 ug via INTRAVENOUS

## 2022-12-26 MED ORDER — ACETAMINOPHEN 10 MG/ML IV SOLN: 1000.0000 mg | Freq: Once | INTRAVENOUS | Status: DC | PRN

## 2022-12-26 MED ORDER — ROCURONIUM BROMIDE 100 MG/10ML IV SOLN
INTRAVENOUS | Status: DC | PRN
  Administered 2022-12-26: 50 mg via INTRAVENOUS

## 2022-12-26 MED ORDER — DROPERIDOL 2.5 MG/ML IJ SOLN: 0.6250 mg | Freq: Once | INTRAMUSCULAR | Status: DC | PRN

## 2022-12-26 MED ORDER — OXYBUTYNIN CHLORIDE 5 MG PO TABS: ORAL_TABLET | ORAL | 0 refills | Status: DC

## 2022-12-26 MED ORDER — OXYCODONE HCL 5 MG PO TABS
5.0000 mg | ORAL_TABLET | Freq: Once | ORAL | Status: AC
  Administered 2022-12-26: 5 mg via ORAL

## 2022-12-26 MED ORDER — OXYCODONE HCL 5 MG PO TABS: 5.0000 mg | ORAL_TABLET | Freq: Once | ORAL | Status: DC | PRN

## 2022-12-26 MED ORDER — SUGAMMADEX SODIUM 200 MG/2ML IV SOLN
INTRAVENOUS | Status: DC | PRN
Start: 2022-12-26 — End: 2022-12-26
  Administered 2022-12-26: 200 mg via INTRAVENOUS

## 2022-12-26 MED ORDER — OXYCODONE HCL 5 MG PO TABS
ORAL_TABLET | ORAL | Status: AC
  Filled 2022-12-26: qty 1

## 2022-12-26 MED ORDER — ONDANSETRON HCL 4 MG/2ML IJ SOLN
INTRAMUSCULAR | Status: DC | PRN
  Administered 2022-12-26: 4 mg via INTRAVENOUS

## 2022-12-26 MED ORDER — CHLORHEXIDINE GLUCONATE 0.12 % MT SOLN
OROMUCOSAL | Status: AC
  Filled 2022-12-26: qty 15

## 2022-12-26 MED ORDER — GLYCOPYRROLATE 0.2 MG/ML IJ SOLN
INTRAMUSCULAR | Status: DC | PRN
  Administered 2022-12-26: .2 mg via INTRAVENOUS

## 2022-12-26 MED ORDER — LIDOCAINE HCL (CARDIAC) PF 100 MG/5ML IV SOSY
PREFILLED_SYRINGE | INTRAVENOUS | Status: DC | PRN
  Administered 2022-12-26: 100 mg via INTRAVENOUS

## 2022-12-26 MED ORDER — HYDROCODONE-ACETAMINOPHEN 5-325 MG PO TABS
1.0000 | ORAL_TABLET | Freq: Four times a day (QID) | ORAL | 0 refills | Status: DC | PRN
Start: 2022-12-26 — End: 2023-02-26

## 2022-12-26 MED ORDER — PROMETHAZINE HCL 25 MG/ML IJ SOLN: 6.2500 mg | INTRAMUSCULAR | Status: DC | PRN

## 2022-12-26 SURGICAL SUPPLY — 29 items
BAG DRAIN SIEMENS DORNER NS (MISCELLANEOUS) ×1 IMPLANT
BAG DRN NS LF (MISCELLANEOUS) ×1
BASKET ZERO TIP 1.9FR (BASKET) IMPLANT
BRUSH SCRUB EZ 1% IODOPHOR (MISCELLANEOUS) ×1 IMPLANT
BSKT STON RTRVL ZERO TP 1.9FR (BASKET)
CATH URET FLEX-TIP 2 LUMEN 10F (CATHETERS) IMPLANT
CATH URETL OPEN END 6X70 (CATHETERS) IMPLANT
CNTNR URN SCR LID CUP LEK RST (MISCELLANEOUS) IMPLANT
CONT SPEC 4OZ STRL OR WHT (MISCELLANEOUS)
DRAPE UTILITY 15X26 TOWEL STRL (DRAPES) ×1 IMPLANT
FIBER LASER MOSES 200 DFL (Laser) IMPLANT
GLOVE BIOGEL PI IND STRL 7.5 (GLOVE) ×1 IMPLANT
GOWN STRL REUS W/ TWL LRG LVL3 (GOWN DISPOSABLE) ×1 IMPLANT
GOWN STRL REUS W/ TWL XL LVL3 (GOWN DISPOSABLE) ×1 IMPLANT
GOWN STRL REUS W/TWL LRG LVL3 (GOWN DISPOSABLE) ×1
GOWN STRL REUS W/TWL XL LVL3 (GOWN DISPOSABLE) ×1
GUIDEWIRE GREEN .038 145CM (MISCELLANEOUS) IMPLANT
GUIDEWIRE STR DUAL SENSOR (WIRE) ×1 IMPLANT
IV NS IRRIG 3000ML ARTHROMATIC (IV SOLUTION) ×1 IMPLANT
KIT TURNOVER CYSTO (KITS) ×1 IMPLANT
PACK CYSTO AR (MISCELLANEOUS) ×1 IMPLANT
SET CYSTO W/LG BORE CLAMP LF (SET/KITS/TRAYS/PACK) ×1 IMPLANT
SHEATH NAVIGATOR HD 12/14X36 (SHEATH) IMPLANT
SHEATH NAVIGATOR HD 12/14X46 (SHEATH) IMPLANT
STENT URET 6FRX24 CONTOUR (STENTS) IMPLANT
STENT URET 6FRX26 CONTOUR (STENTS) IMPLANT
SURGILUBE 2OZ TUBE FLIPTOP (MISCELLANEOUS) ×1 IMPLANT
VALVE UROSEAL ADJ ENDO (VALVE) IMPLANT
WATER STERILE IRR 500ML POUR (IV SOLUTION) ×1 IMPLANT

## 2022-12-26 NOTE — Anesthesia Procedure Notes (Signed)
Procedure Name: Intubation Date/Time: 12/26/2022 12:58 PM  Performed by: Mohammed Kindle, CRNAPre-anesthesia Checklist: Patient identified, Emergency Drugs available, Suction available and Patient being monitored Patient Re-evaluated:Patient Re-evaluated prior to induction Oxygen Delivery Method: Circle system utilized Preoxygenation: Pre-oxygenation with 100% oxygen Induction Type: IV induction Ventilation: Mask ventilation without difficulty Laryngoscope Size: McGraph and 4 Grade View: Grade I Tube type: Oral Tube size: 7.5 mm Number of attempts: 1 Airway Equipment and Method: Stylet Placement Confirmation: ETT inserted through vocal cords under direct vision, positive ETCO2, breath sounds checked- equal and bilateral and CO2 detector Secured at: 23 cm Tube secured with: Tape Dental Injury: Teeth and Oropharynx as per pre-operative assessment  Comments: Inserted by Hassel Neth

## 2022-12-26 NOTE — Interval H&P Note (Signed)
History and Physical Interval Note:  12/26/2022 12:46 PM  Leonard Trujillo  has presented today for surgery, with the diagnosis of Left Nephrolithiasis.  The various methods of treatment have been discussed with the patient and family. After consideration of risks, benefits and other options for treatment, the patient has consented to  Procedure(s): CYSTOSCOPY/URETEROSCOPY/HOLMIUM LASER/STENT PLACEMENT (Left) as a surgical intervention.  The patient's history has been reviewed, patient examined, no change in status, stable for surgery.  I have reviewed the patient's chart and labs.  Questions were answered to the patient's satisfaction.    We also discussed in a small percentage of cases the stone cannot be treated due to ureteral anatomy and inability to access the stone with the ureteroscope.  If this were to occur a stent will remain indwelling for 2 weeks to allow for passive dilation and he would need follow-up ureteroscopy.  We also discussed the potential need for a staged procedure.   CV: RRR Lungs: Clear  [name] C Norene Oliveri

## 2022-12-26 NOTE — Discharge Instructions (Addendum)
AMBULATORY SURGERY  DISCHARGE INSTRUCTIONS   The drugs that you were given will stay in your system until tomorrow so for the next 24 hours you should not:  Drive an automobile Make any legal decisions Drink any alcoholic beverage   You may resume regular meals tomorrow.  Today it is better to start with liquids and gradually work up to solid foods.  You may eat anything you prefer, but it is better to start with liquids, then soup and crackers, and gradually work up to solid foods.   Please notify your doctor immediately if you have any unusual bleeding, trouble breathing, redness and pain at the surgery site, drainage, fever, or pain not relieved by medication.   Your post-operative visit with Dr.                                     is: Date:                        Time:    Please call to schedule your post-operative visit.  Additional Instructions:  DISCHARGE INSTRUCTIONS FOR KIDNEY STONE/URETERAL STENT   MEDICATIONS:  1. Resume all your other meds from home.  2.  AZO (over-the-counter) can help with the burning/stinging when you urinate. 3.  Hydrocodone is for moderate/severe pain, Rx was sent to your pharmacy. 4.  Nausea medication was sent to your pharmacy 5.  Rxs for tamsulosin and oxybutynin which will help with stent/bladder irritation were sent to your pharmacy  ACTIVITY:  1. May resume regular activities in 24 hours. 2. No driving while on narcotic pain medications  3. Drink plenty of water  4. Continue to walk at home - you can still get blood clots when you are at home, so keep active, but don't over do it.  5. May return to work/school tomorrow or when you feel ready   BATHING:  1. You can shower.   SIGNS/SYMPTOMS TO CALL:  Common postoperative symptoms include urinary frequency, urgency, bladder spasm and blood in the urine  Please call us if you have a fever greater than 101.5, uncontrolled nausea/vomiting, uncontrolled pain, dizziness, unable to  urinate, excessively bloody urine, chest pain, shortness of breath, leg swelling, leg pain, or any other concerns or questions.   You can reach [place] at [phone].   FOLLOW-UP:  1.  Please have a KUB done Friday 9/12.  You will be contacted with results and further follow-up recommendations will be provided at that time

## 2022-12-26 NOTE — Anesthesia Preprocedure Evaluation (Addendum)
Anesthesia Evaluation  Patient identified by MRN, date of birth, ID band Patient awake    Reviewed: Allergy & Precautions, NPO status , Patient's Chart, lab work & pertinent test results  History of Anesthesia Complications (+) PONV and history of anesthetic complications  Airway Mallampati: III  TM Distance: >3 FB Neck ROM: full    Dental no notable dental hx.    Pulmonary neg pulmonary ROS   Pulmonary exam normal        Cardiovascular + CAD  Normal cardiovascular exam  EKG 9/5 Normal sinus rhythm Lateral infarct , age undetermined Inferior infarct (cited on or before 31-May-2010)   Neuro/Psych negative neurological ROS  negative psych ROS   GI/Hepatic negative GI ROS, Neg liver ROS,,,  Endo/Other  diabetes, Type 2    Renal/GU      Musculoskeletal   Abdominal   Peds  Hematology negative hematology ROS (+)   Anesthesia Other Findings Past Medical History: No date: Atherosclerosis of aorta (HCC) No date: Atherosclerosis of native coronary artery of native heart  without angina pectoris 01/18/2022: Basal cell carcinoma     Comment:  Right Lower Cheek above Jaw, MOHs 06/06/22 No date: BPH (benign prostatic hyperplasia) No date: Diabetes mellitus without complication (HCC) No date: Family history of adverse reaction to anesthesia     Comment:  Mother PONV No date: Glaucoma No date: History of kidney stones No date: Insomnia No date: Nephrolithiasis No date: PONV (postoperative nausea and vomiting)     Comment:  severe No date: Pure hypercholesterolemia  Past Surgical History: 2015: EYE SURGERY     Comment:  cataract with lens No date: MOHS SURGERY     Comment:  rt cheek 2017: Shockwave lithotripsy No date: URETEROSCOPY  BMI    Body Mass Index: 20.67 kg/m      Reproductive/Obstetrics negative OB ROS                             Anesthesia Physical Anesthesia  Plan  ASA: 3  Anesthesia Plan: General ETT   Post-op Pain Management: Toradol IV (intra-op)* and Ofirmev IV (intra-op)*   Induction: Intravenous  PONV Risk Score and Plan: 4 or greater and Ondansetron, Treatment may vary due to age or medical condition, Dexamethasone, TIVA and Propofol infusion  Airway Management Planned: Oral ETT  Additional Equipment:   Intra-op Plan:   Post-operative Plan:   Informed Consent: I have reviewed the patients History and Physical, chart, labs and discussed the procedure including the risks, benefits and alternatives for the proposed anesthesia with the patient or authorized representative who has indicated his/her understanding and acceptance.     Dental Advisory Given  Plan Discussed with: Anesthesiologist, CRNA and Surgeon  Anesthesia Plan Comments: (Patient consented for risks of anesthesia including but not limited to:  - adverse reactions to medications - risk of airway placement if required - damage to eyes, teeth, lips or other oral mucosa - nerve damage due to positioning  - sore throat or hoarseness - Damage to heart, brain, nerves, lungs, other parts of body or loss of life  Patient voiced understanding.)        Anesthesia Quick Evaluation

## 2022-12-26 NOTE — Transfer of Care (Signed)
Immediate Anesthesia Transfer of Care Note  Patient: Leonard Trujillo  Procedure(s) Performed: CYSTOSCOPY/URETEROSCOPY/HOLMIUM LASER/STENT PLACEMENT (Left: Ureter)  Patient Location: PACU  Anesthesia Type:General  Level of Consciousness: drowsy  Airway & Oxygen Therapy: Patient Spontanous Breathing and Patient connected to face mask oxygen  Post-op Assessment: Report given to RN and Post -op Vital signs reviewed and stable  Post vital signs: Reviewed and stable  Last Vitals:  Vitals Value Taken Time  BP 139/82 12/26/22 1427  Temp 37 C 12/26/22 1427  Pulse 67 12/26/22 1427  Resp 16 12/26/22 1427  SpO2 100 % 12/26/22 1427  Vitals shown include unfiled device data.  Last Pain:  Vitals:   12/26/22 1053  TempSrc: Temporal  PainSc: 2          Complications: No notable events documented.

## 2022-12-27 ENCOUNTER — Encounter: Payer: Self-pay | Admitting: Urology

## 2022-12-27 ENCOUNTER — Other Ambulatory Visit: Payer: Self-pay | Admitting: Urology

## 2022-12-27 DIAGNOSIS — N200 Calculus of kidney: Secondary | ICD-10-CM

## 2022-12-27 NOTE — Op Note (Signed)
Preoperative diagnosis: Left nephrolithiasis   Postoperative diagnosis: Left nephrolithiasis  Procedure:  Cystoscopy Left ureteroscopy and stone removal Ureteroscopic laser lithotripsy Left ureteral stent placement (26F/24 cm)  Left retrograde pyelography with interpretation  Surgeon: Lorin Picket C. Juanya Villavicencio, M.D.  Anesthesia: General  Complications: None  Intraoperative findings:  Cystoscopy-urethra normal in caliber without stricture; TUR changes with right lateral lobe regrowth proximally and left lateral regrowth distally-prostatic calcifications; bladder mucosa normal in appearance without erythema, solid or papillary lesions Ureteropyeloscopy-ureter normal in appearance without calculi.  Calculus identified left renal pelvis extending into a lower pole infundibulum Retrograde pyelography demonstrated a filling defect within the renal pelvis/infundibulum consistent with the patient's known calculus without other abnormalities. Retrograde pyelography post procedure showed no filling defects, stone fragments or contrast extravasation  EBL: Minimal  Specimens: None  Indication: Leonard Trujillo is a 72 y.o. with an asymptomatic left infundibular calculus however on recent KUB he had interval stone growth with extension of stone burden into the renal pelvis.  Greatest dimension of calculus measures ~ 18 mm.  We discussed possible need for a staged procedure.  After reviewing the management options for treatment, the patient elected to proceed with the above surgical procedure(s). We have discussed the potential benefits and risks of the procedure, side effects of the proposed treatment, the likelihood of the patient achieving the goals of the procedure, and any potential problems that might occur during the procedure or recuperation. Informed consent has been obtained.  Description of procedure:  The patient was taken to the operating room and general anesthesia was induced.  The patient was  placed in the dorsal lithotomy position, prepped and draped in the usual sterile fashion, and preoperative antibiotics were administered. A preoperative time-out was performed.   A 22 French cystoscope was lubricated and passed under direct vision.  The urethra was normal in caliber without stricture.  The prostate demonstrated *** lateral lobe enlargement and ***median lobe/bladder neck.  Panendoscopy was performed and the bladder mucosa showed no erythema, solid or papillary lesions.  Attention then turned to the *** ureteral orifice and a ureteral catheter was used to intubate the ureteral orifice.  Omnipaque contrast was injected through the ureteral catheter and a retrograde pyelogram was performed with findings as dictated above.  Attention was directed to the *** ureteral orifice and a 0.038 Sensor wire was then advanced up the *** ureter into the renal pelvis under fluoroscopic guidance.  The cystoscope was removed and a dual-lumen catheter was placed over the sensor wire and a second sensor wire was placed in a similar fashion.  A 12/14 French ureteral access sheath was placed over the working wire under fluoroscopic guidance without difficulty.  A digital flexible ureteroscope was placed through the access sheath and advanced into the renal pelvis without difficulty.  The calculus was identified ***.    A 273 micron holmium laser fiber was placed through the ureteroscope and the stone was dusted/fragmented at a setting of ***J and frequency of *** Hz.   All stone fragments were then removed from the collecting system with a zero tip nitinol basket.  Retrograde pyelogram was performed and each calyx was sequentially examined under fluoroscopic guidance and no significant size fragments were identified.  The ureteral access sheath and ureteroscope were removed in tandem and the ureter showed no evidence of injury or perforation.  The wire was then backloaded through the cystoscope and a  ureteral stent was advance over the wire using Seldinger technique. A *** FR/ ***  CM stent was was placed under fluoroscopic guidance.  The wire was then removed with an adequate stent curl noted in the renal pelvis as well as in the bladder.  The bladder was then emptied and the procedure ended.  The patient appeared to tolerate the procedure well and without complications.  After anesthetic reversal the patient was transported to the PACU in stable condition.

## 2022-12-28 NOTE — Anesthesia Postprocedure Evaluation (Signed)
Anesthesia Post Note  Patient: Leonard Trujillo  Procedure(s) Performed: CYSTOSCOPY/URETEROSCOPY/HOLMIUM LASER/STENT PLACEMENT (Left: Ureter)  Patient location during evaluation: PACU Anesthesia Type: General Level of consciousness: awake and alert Pain management: pain level controlled Vital Signs Assessment: post-procedure vital signs reviewed and stable Respiratory status: spontaneous breathing, nonlabored ventilation, respiratory function stable and patient connected to nasal cannula oxygen Cardiovascular status: blood pressure returned to baseline and stable Postop Assessment: no apparent nausea or vomiting Anesthetic complications: no   No notable events documented.   Last Vitals:  Vitals:   12/26/22 1550 12/26/22 1628  BP: 139/77 130/69  Pulse: (!) 57 (!) 56  Resp: 14 16  Temp: 36.5 C   SpO2: 100% 99%    Last Pain:  Vitals:   12/27/22 1339  TempSrc:   PainSc: 2                  Yevette Edwards

## 2022-12-29 ENCOUNTER — Ambulatory Visit
Admission: RE | Admit: 2022-12-29 | Discharge: 2022-12-29 | Disposition: A | Discharge status: Home / Self Care | Payer: Medicare HMO | Source: Ambulatory Visit | Attending: Urology | Admitting: Urology

## 2022-12-29 ENCOUNTER — Ambulatory Visit
Admission: RE | Admit: 2022-12-29 | Discharge: 2022-12-29 | Disposition: A | Discharge status: Home / Self Care | Payer: Medicare HMO | Attending: Urology | Admitting: Urology

## 2022-12-29 DIAGNOSIS — N200 Calculus of kidney: Secondary | ICD-10-CM

## 2023-01-01 ENCOUNTER — Telehealth: Payer: Self-pay

## 2023-01-01 NOTE — Telephone Encounter (Signed)
Spoke with pt. Pt. Advised. Patient would like to be scheduled for 2nd look next week 09/24. Sent to [facility] for Orders. Once received will touch base again with the patient and proceed from there. Patient verbalized understanding. All questions answered.

## 2023-01-01 NOTE — Telephone Encounter (Signed)
-----   Message from Verna Czech [facility] sent at 12/31/2022  2:55 PM EDT ----- Regarding: Follow-up KUB reviewed and he does have a fairly significant amount of residual calculi in the lower pole.  Recommend second look ureteroscopy.  Please let me know when he would like to schedule and I will send you orders.

## 2023-01-04 ENCOUNTER — Telehealth: Payer: Self-pay

## 2023-01-04 ENCOUNTER — Other Ambulatory Visit: Payer: Self-pay | Admitting: Urology

## 2023-01-04 ENCOUNTER — Other Ambulatory Visit: Payer: Self-pay

## 2023-01-04 DIAGNOSIS — N200 Calculus of kidney: Secondary | ICD-10-CM

## 2023-01-04 MED ORDER — FINASTERIDE 5 MG PO TABS: 5.0000 mg | ORAL_TABLET | Freq: Every day | ORAL | 12 refills | Status: DC

## 2023-01-04 MED ORDER — TAMSULOSIN HCL 0.4 MG PO CAPS: 0.4000 mg | ORAL_CAPSULE | Freq: Every day | ORAL | 0 refills | Status: DC

## 2023-01-04 MED ORDER — OXYBUTYNIN CHLORIDE 5 MG PO TABS: ORAL_TABLET | ORAL | 0 refills | Status: AC

## 2023-01-04 NOTE — Progress Notes (Signed)
   [facility] Surgical Posting Form  Surgery Date: Date: 01/09/2023  Surgeon: Dr. Irineo Axon, MD  Inpt ( No  )   Outpt (Yes)   Obs ( No  )   Diagnosis: N20.0 Left Nephrolithiasis  -CPT: [phone]  Surgery: Left Ureteroscopy with Laser Lithotripsy and Stent Exchange  Stop Anticoagulations: No  Cardiac/Medical/Pulmonary Clearance needed: no  *Orders entered into EPIC  Date: 01/04/23   *Case booked in EPIC  Date: 01/04/23  *Notified pt of Surgery: Date: 01/04/23  PRE-OP UA & CX: no  *Placed into Prior Authorization Work [place] Date: 01/04/23  Assistant/laser/rep:No

## 2023-01-04 NOTE — Telephone Encounter (Signed)
Per Dr. Lonna Cobb, Patient is to be scheduled for Left Ureteroscopy with Laser Lithotripsy and Stent Exchange   Mr. Leonard Trujillo was contacted and possible surgical dates were discussed, Tuesday September 24th, 2024 was agreed upon for surgery.  .   Patient was directed to call [phone] between 1-3pm the day before surgery to find out surgical arrival time.  Instructions were given not to eat or drink from midnight on the night before surgery and have a driver for the day of surgery. On the surgery day patient was instructed to enter through the [facility] entrance of [facility] report the Same Day Surgery desk.   Pre-Admit Testing will be in contact via phone to set up an interview with the anesthesia team to review your history and medications prior to surgery.   Reminder of this information was sent via MyChart to the patient.

## 2023-01-04 NOTE — Progress Notes (Signed)
Surgical Physician Order Form [facility]  Dr. Irineo Axon, MD  * Scheduling expectation : 9/24  *Length of Case: 60 minutes  *Clearance needed: no  *Anticoagulation Instructions: N/A  *Aspirin Instructions: N/A  *Post-op visit Date/Instructions:  1 week cysto stent removal  *Diagnosis: Left Nephrolithiasis  *Procedure: Left  Ureteroscopy w/laser lithotripsy & stent exchange ([id number])   Additional orders: N/A  -Admit type: OUTpatient  -Anesthesia: General  -VTE Prophylaxis Standing Order SCD's       Other:   -Standing Lab Orders Per Anesthesia    Lab other: None  -Standing Test orders EKG/Chest x-ray per Anesthesia       Test other:   - Medications:  Gentamicin per pharmacy  -Other orders:  N/A

## 2023-01-08 ENCOUNTER — Encounter
Admission: RE | Admit: 2023-01-08 | Discharge: 2023-01-08 | Disposition: A | Discharge status: Home / Self Care | Payer: Medicare HMO | Source: Ambulatory Visit | Attending: Urology | Admitting: Urology

## 2023-01-08 VITALS — Ht 69.0 in | Wt 140.0 lb

## 2023-01-08 DIAGNOSIS — Z01812 Encounter for preprocedural laboratory examination: Secondary | ICD-10-CM

## 2023-01-08 DIAGNOSIS — E119 Type 2 diabetes mellitus without complications: Secondary | ICD-10-CM

## 2023-01-08 MED ORDER — FAMOTIDINE 20 MG PO TABS
20.0000 mg | ORAL_TABLET | Freq: Once | ORAL | Status: AC
  Administered 2023-01-09: 20 mg via ORAL

## 2023-01-08 MED ORDER — SODIUM CHLORIDE 0.9 % IV SOLN: INTRAVENOUS | Status: DC

## 2023-01-08 MED ORDER — CHLORHEXIDINE GLUCONATE 0.12 % MT SOLN
15.0000 mL | Freq: Once | OROMUCOSAL | Status: AC
  Administered 2023-01-09: 15 mL via OROMUCOSAL

## 2023-01-08 MED ORDER — ORAL CARE MOUTH RINSE: 15.0000 mL | Freq: Once | OROMUCOSAL | Status: AC

## 2023-01-08 MED ORDER — GENTAMICIN SULFATE 40 MG/ML IJ SOLN
5.0000 mg/kg | INTRAVENOUS | Status: AC
  Administered 2023-01-09: 320 mg via INTRAVENOUS
  Filled 2023-01-08: qty 8

## 2023-01-08 NOTE — Patient Instructions (Signed)
Your procedure is scheduled on: Tuesday, September 24 Report to the Registration Desk on the 1st floor of the [facility] To find out your arrival time, please call [phone] between 1PM - 3PM on: Monday, September 23 If your arrival time is 6:00 am, do not arrive before that time as the [facility] entrance doors do not open until 6:00 am.  REMEMBER: Instructions that are not followed completely may result in serious medical risk, up to and including death; or upon the discretion of your surgeon and anesthesiologist your surgery may need to be rescheduled.  Do not eat or drink after midnight the night before surgery.  No gum chewing or hard candies.  One week prior to surgery: Stop Anti-inflammatories (NSAIDS) such as Advil, Aleve, Ibuprofen, Motrin, Naproxen, Naprosyn and Aspirin based products such as Excedrin, Goody's Powder, BC Powder. Stop ANY OVER THE COUNTER supplements until after surgery. You may however, continue to take Tylenol if needed for pain up until the day of surgery.  Continue taking all prescribed medications with the exception of the following:  Metformin - hold for 2 days before surgery. Last day to take is Saturday, September 21. Resume AFTER surgery.  TAKE ONLY THESE MEDICATIONS THE MORNING OF SURGERY WITH A SIP OF WATER:  Dorzolamide-timolol eye drops Finasteride (Proscar)  No Alcohol for 24 hours before or after surgery.  No Smoking including e-cigarettes for 24 hours before surgery.  No chewable tobacco products for at least 6 hours before surgery.  No nicotine patches on the day of surgery.  Do not use any "recreational" drugs for at least a week (preferably 2 weeks) before your surgery.  Please be advised that the combination of cocaine and anesthesia may have negative outcomes, up to and including death. If you test positive for cocaine, your surgery will be cancelled.  On the morning of surgery brush your teeth with toothpaste and water, you  may rinse your mouth with mouthwash if you wish. Do not swallow any toothpaste or mouthwash.  Do not wear jewelry, make-up, hairpins, clips or nail polish.  For welded (permanent) jewelry: bracelets, anklets, waist bands, etc.  Please have this removed prior to surgery.  If it is not removed, there is a chance that hospital personnel will need to cut it off on the day of surgery.  Do not wear lotions, powders, or perfumes.   Do not shave body hair from the neck down 48 hours before surgery.  Contact lenses, hearing aids and dentures may not be worn into surgery.  Do not bring valuables to the hospital. [facility] is not responsible for any missing/lost belongings or valuables.   Notify your doctor if there is any change in your medical condition (cold, fever, infection).  Wear comfortable clothing (specific to your surgery type) to the hospital.  After surgery, you can help prevent lung complications by doing breathing exercises.  Take deep breaths and cough every 1-2 hours.   If you are being discharged the day of surgery, you will not be allowed to drive home. You will need a responsible individual to drive you home and stay with you for 24 hours after surgery.   If you are taking public transportation, you will need to have a responsible individual with you.  Please call the Pre-admissions Testing Dept. at [phone] if you have any questions about these instructions.  Surgery Visitation Policy:  Patients having surgery or a procedure may have two visitors.  Children under the age of 34  must have an adult with them who is not the patient.

## 2023-01-09 ENCOUNTER — Ambulatory Visit
Admission: RE | Admit: 2023-01-09 | Discharge: 2023-01-09 | Disposition: A | Discharge status: Home / Self Care | Payer: Medicare HMO | Attending: Urology | Admitting: Urology

## 2023-01-09 ENCOUNTER — Ambulatory Visit: Payer: Medicare HMO | Admitting: Certified Registered"

## 2023-01-09 ENCOUNTER — Encounter: Payer: Self-pay | Admitting: Urology

## 2023-01-09 ENCOUNTER — Other Ambulatory Visit: Payer: Self-pay

## 2023-01-09 ENCOUNTER — Ambulatory Visit: Payer: Medicare HMO | Admitting: Urgent Care

## 2023-01-09 ENCOUNTER — Encounter
Admission: RE | Disposition: A | Discharge status: Home / Self Care | Payer: Self-pay | Source: Home / Self Care | Attending: Urology

## 2023-01-09 ENCOUNTER — Ambulatory Visit: Payer: Medicare HMO

## 2023-01-09 DIAGNOSIS — N400 Enlarged prostate without lower urinary tract symptoms: Secondary | ICD-10-CM | POA: Insufficient documentation

## 2023-01-09 DIAGNOSIS — Z01812 Encounter for preprocedural laboratory examination: Secondary | ICD-10-CM

## 2023-01-09 DIAGNOSIS — N200 Calculus of kidney: Secondary | ICD-10-CM | POA: Insufficient documentation

## 2023-01-09 DIAGNOSIS — E119 Type 2 diabetes mellitus without complications: Secondary | ICD-10-CM

## 2023-01-09 DIAGNOSIS — E1136 Type 2 diabetes mellitus with diabetic cataract: Secondary | ICD-10-CM | POA: Insufficient documentation

## 2023-01-09 DIAGNOSIS — Z85828 Personal history of other malignant neoplasm of skin: Secondary | ICD-10-CM | POA: Diagnosis not present

## 2023-01-09 DIAGNOSIS — I2510 Atherosclerotic heart disease of native coronary artery without angina pectoris: Secondary | ICD-10-CM | POA: Diagnosis not present

## 2023-01-09 HISTORY — PX: CYSTOSCOPY/URETEROSCOPY/HOLMIUM LASER/STENT PLACEMENT: SHX6546

## 2023-01-09 LAB — GLUCOSE, CAPILLARY
Glucose-Capillary: 95 mg/dL (ref 70–99)
Glucose-Capillary: 143 mg/dL — ABNORMAL HIGH (ref 70–99)

## 2023-01-09 SURGERY — CYSTOSCOPY/URETEROSCOPY/HOLMIUM LASER/STENT PLACEMENT
Anesthesia: General | Site: Penis | Laterality: Left

## 2023-01-09 MED ORDER — PROPOFOL 10 MG/ML IV BOLUS
INTRAVENOUS | Status: DC | PRN
  Administered 2023-01-09: 150 mg via INTRAVENOUS

## 2023-01-09 MED ORDER — SODIUM CHLORIDE 0.9 % IR SOLN
Status: DC | PRN
  Administered 2023-01-09: 3000 mL

## 2023-01-09 MED ORDER — STERILE WATER FOR IRRIGATION IR SOLN
Status: DC | PRN
Start: 2023-01-09 — End: 2023-01-09
  Administered 2023-01-09: 500 mL

## 2023-01-09 MED ORDER — ONDANSETRON HCL 4 MG/2ML IJ SOLN
INTRAMUSCULAR | Status: AC
  Filled 2023-01-09: qty 2

## 2023-01-09 MED ORDER — FAMOTIDINE 20 MG PO TABS
ORAL_TABLET | ORAL | Status: AC
  Filled 2023-01-09: qty 1

## 2023-01-09 MED ORDER — ONDANSETRON HCL 4 MG/2ML IJ SOLN
INTRAMUSCULAR | Status: DC | PRN
  Administered 2023-01-09: 4 mg via INTRAVENOUS

## 2023-01-09 MED ORDER — CHLORHEXIDINE GLUCONATE 0.12 % MT SOLN
OROMUCOSAL | Status: AC
  Filled 2023-01-09: qty 15

## 2023-01-09 MED ORDER — FENTANYL CITRATE (PF) 100 MCG/2ML IJ SOLN
INTRAMUSCULAR | Status: AC
  Filled 2023-01-09: qty 2

## 2023-01-09 MED ORDER — SUGAMMADEX SODIUM 200 MG/2ML IV SOLN
INTRAVENOUS | Status: DC | PRN
  Administered 2023-01-09: 200 mg via INTRAVENOUS

## 2023-01-09 MED ORDER — ROCURONIUM BROMIDE 100 MG/10ML IV SOLN
INTRAVENOUS | Status: DC | PRN
  Administered 2023-01-09: 50 mg via INTRAVENOUS

## 2023-01-09 MED ORDER — DEXAMETHASONE SODIUM PHOSPHATE 10 MG/ML IJ SOLN
INTRAMUSCULAR | Status: AC
  Filled 2023-01-09: qty 1

## 2023-01-09 MED ORDER — PROPOFOL 10 MG/ML IV BOLUS
INTRAVENOUS | Status: AC
  Filled 2023-01-09: qty 20

## 2023-01-09 MED ORDER — LIDOCAINE HCL (CARDIAC) PF 100 MG/5ML IV SOSY
PREFILLED_SYRINGE | INTRAVENOUS | Status: DC | PRN
  Administered 2023-01-09: 60 mg via INTRAVENOUS

## 2023-01-09 MED ORDER — DEXAMETHASONE SODIUM PHOSPHATE 10 MG/ML IJ SOLN
INTRAMUSCULAR | Status: DC | PRN
  Administered 2023-01-09: 5 mg via INTRAVENOUS

## 2023-01-09 MED ORDER — PROPOFOL 1000 MG/100ML IV EMUL
INTRAVENOUS | Status: AC
  Filled 2023-01-09: qty 100

## 2023-01-09 MED ORDER — FENTANYL CITRATE (PF) 100 MCG/2ML IJ SOLN
INTRAMUSCULAR | Status: DC | PRN
  Administered 2023-01-09: 25 ug via INTRAVENOUS
  Administered 2023-01-09: 50 ug via INTRAVENOUS
  Administered 2023-01-09: 25 ug via INTRAVENOUS

## 2023-01-09 MED ORDER — IOHEXOL 180 MG/ML  SOLN
INTRAMUSCULAR | Status: DC | PRN
  Administered 2023-01-09: 20 mL

## 2023-01-09 MED ORDER — PROPOFOL 500 MG/50ML IV EMUL
INTRAVENOUS | Status: DC | PRN
Start: 2023-01-09 — End: 2023-01-09
  Administered 2023-01-09: 120 ug/kg/min via INTRAVENOUS

## 2023-01-09 SURGICAL SUPPLY — 27 items
BAG DRAIN SIEMENS DORNER NS (MISCELLANEOUS) ×1 IMPLANT
BAG DRN NS LF (MISCELLANEOUS) ×1
BASKET ZERO TIP 1.9FR (BASKET) IMPLANT
BRUSH SCRUB EZ 1% IODOPHOR (MISCELLANEOUS) ×1 IMPLANT
BSKT STON RTRVL ZERO TP 1.9FR (BASKET) ×1
CATH URET FLEX-TIP 2 LUMEN 10F (CATHETERS) IMPLANT
CATH URETL OPEN END 6X70 (CATHETERS) IMPLANT
CNTNR URN SCR LID CUP LEK RST (MISCELLANEOUS) IMPLANT
CONT SPEC 4OZ STRL OR WHT (MISCELLANEOUS)
DRAPE UTILITY 15X26 TOWEL STRL (DRAPES) ×1 IMPLANT
FIBER LASER MOSES 200 DFL (Laser) IMPLANT
GLOVE BIOGEL PI IND STRL 7.5 (GLOVE) ×1 IMPLANT
GOWN STRL REUS W/ TWL LRG LVL3 (GOWN DISPOSABLE) ×1 IMPLANT
GOWN STRL REUS W/ TWL XL LVL3 (GOWN DISPOSABLE) ×1 IMPLANT
GOWN STRL REUS W/TWL LRG LVL3 (GOWN DISPOSABLE) ×1
GOWN STRL REUS W/TWL XL LVL3 (GOWN DISPOSABLE) ×1
GUIDEWIRE STR DUAL SENSOR (WIRE) ×1 IMPLANT
IV NS IRRIG 3000ML ARTHROMATIC (IV SOLUTION) ×1 IMPLANT
KIT TURNOVER CYSTO (KITS) ×1 IMPLANT
PACK CYSTO AR (MISCELLANEOUS) ×1 IMPLANT
SET CYSTO W/LG BORE CLAMP LF (SET/KITS/TRAYS/PACK) ×1 IMPLANT
SHEATH NAVIGATOR HD 12/14X36 (SHEATH) IMPLANT
STENT URET 6FRX24 CONTOUR (STENTS) IMPLANT
STENT URET 6FRX26 CONTOUR (STENTS) IMPLANT
SURGILUBE 2OZ TUBE FLIPTOP (MISCELLANEOUS) ×1 IMPLANT
VALVE UROSEAL ADJ ENDO (VALVE) IMPLANT
WATER STERILE IRR 500ML POUR (IV SOLUTION) ×1 IMPLANT

## 2023-01-09 NOTE — Transfer of Care (Signed)
Immediate Anesthesia Transfer of Care Note  Patient: Leonard Trujillo  Procedure(s) Performed: CYSTOSCOPY/URETEROSCOPY/HOLMIUM LASER/STENT EXCHANGE (Left: Penis)  Patient Location: PACU  Anesthesia Type:General  Level of Consciousness: drowsy  Airway & Oxygen Therapy: Patient Spontanous Breathing and Patient connected to face mask oxygen  Post-op Assessment: Report given to RN and Post -op Vital signs reviewed and stable  Post vital signs: Reviewed and stable  Last Vitals:  Vitals Value Taken Time  BP 113/73 01/09/23 0945  Temp 36.7 C 01/09/23 0944  Pulse 55 01/09/23 0946  Resp 14 01/09/23 0946  SpO2 100 % 01/09/23 0946  Vitals shown include unfiled device data.  Last Pain:  Vitals:   01/09/23 0721  TempSrc:   PainSc: 0-No pain         Complications: No notable events documented.

## 2023-01-09 NOTE — Anesthesia Procedure Notes (Signed)
Procedure Name: Intubation Date/Time: 01/09/2023 8:53 AM  Performed by: Monico Hoar, CRNAPre-anesthesia Checklist: Patient identified, Patient being monitored, Timeout performed, Emergency Drugs available and Suction available Patient Re-evaluated:Patient Re-evaluated prior to induction Oxygen Delivery Method: Circle system utilized Preoxygenation: Pre-oxygenation with 100% oxygen Induction Type: IV induction Ventilation: Mask ventilation without difficulty Laryngoscope Size: McGraph and 4 Grade View: Grade I Tube type: Oral Tube size: 7.5 mm Number of attempts: 1 Airway Equipment and Method: Stylet Placement Confirmation: ETT inserted through vocal cords under direct vision, positive ETCO2 and breath sounds checked- equal and bilateral Secured at: 21 cm Tube secured with: Tape Dental Injury: Teeth and Oropharynx as per pre-operative assessment

## 2023-01-09 NOTE — Discharge Instructions (Addendum)
AMBULATORY SURGERY  DISCHARGE INSTRUCTIONS   The drugs that you were given will stay in your system until tomorrow so for the next 24 hours you should not:  Drive an automobile Make any legal decisions Drink any alcoholic beverage   You may resume regular meals tomorrow.  Today it is better to start with liquids and gradually work up to solid foods.  You may eat anything you prefer, but it is better to start with liquids, then soup and crackers, and gradually work up to solid foods.   Please notify your doctor immediately if you have any unusual bleeding, trouble breathing, redness and pain at the surgery site, drainage, fever, or pain not relieved by medication.  Please call to schedule your post-operative visit.  DISCHARGE INSTRUCTIONS FOR KIDNEY STONE/URETERAL STENT   MEDICATIONS:  1. Resume all your other meds from home.  2.  AZO (over-the-counter) can help with the burning/stinging when you urinate. 3.  Continue tamsulosin and oxybutynin as needed for stent irritation  ACTIVITY:  1. May resume regular activities in 24 hours. 2. No driving while on narcotic pain medications  3. Drink plenty of water  4. Continue to walk at home - you can still get blood clots when you are at home, so keep active, but don't over do it.  5. May return to work/school tomorrow or when you feel ready   BATHING:  1. You can shower. 2. You have a string coming from your urethra: The stent string is attached to your ureteral stent. Do not pull on this.   SIGNS/SYMPTOMS TO CALL:  Common postoperative symptoms include urinary frequency, urgency, bladder spasm and blood in the urine  Please call us if you have a fever greater than 101.5, uncontrolled nausea/vomiting, uncontrolled pain, dizziness, unable to urinate, excessively bloody urine, chest pain, shortness of breath, leg swelling, leg pain, or any other concerns or questions.   You can reach [place] at [phone].   FOLLOW-UP:  1. You will  be contacted for a follow-up appointment in approximately 1 month. 2. You have a string attached to your stent, you may remove it on Friday, 01/12/2023. To do this, pull the string until the stent is completely removed. You may feel an odd sensation in your back.

## 2023-01-09 NOTE — Anesthesia Preprocedure Evaluation (Signed)
Anesthesia Evaluation  Patient identified by MRN, date of birth, ID band Patient awake    Reviewed: Allergy & Precautions, H&P , NPO status , Patient's Chart, lab work & pertinent test results, reviewed documented beta blocker date and time   History of Anesthesia Complications (+) PONV and history of anesthetic complications  Airway Mallampati: II   Neck ROM: full    Dental  (+) Poor Dentition   Pulmonary neg pulmonary ROS   Pulmonary exam normal        Cardiovascular Exercise Tolerance: Good + CAD  Normal cardiovascular exam Rhythm:regular Rate:Normal     Neuro/Psych negative neurological ROS  negative psych ROS   GI/Hepatic negative GI ROS, Neg liver ROS,,,  Endo/Other  negative endocrine ROSdiabetes, Well Controlled    Renal/GU Renal disease  negative genitourinary   Musculoskeletal   Abdominal   Peds  Hematology negative hematology ROS (+)   Anesthesia Other Findings Past Medical History: No date: Atherosclerosis of aorta (HCC) No date: Atherosclerosis of native coronary artery of native heart  without angina pectoris 01/18/2022: Basal cell carcinoma     Comment:  Right Lower Cheek above Jaw, MOHs 06/06/22 No date: BPH (benign prostatic hyperplasia) No date: Diabetes mellitus without complication (HCC) No date: Family history of adverse reaction to anesthesia     Comment:  Mother PONV No date: Glaucoma No date: History of kidney stones No date: Insomnia No date: Nephrolithiasis No date: PONV (postoperative nausea and vomiting)     Comment:  severe No date: Pure hypercholesterolemia Past Surgical History: 12/26/2022: CYSTOSCOPY/URETEROSCOPY/HOLMIUM LASER/STENT PLACEMENT; Left     Comment:  Procedure: CYSTOSCOPY/URETEROSCOPY/HOLMIUM LASER/STENT               PLACEMENT;  Surgeon: Riki Altes, MD;  Location:               [facility];  Service: Urology;  Laterality: Left; 2015: EYE SURGERY      Comment:  cataract with lens No date: MOHS SURGERY     Comment:  rt cheek 2017: Shockwave lithotripsy No date: URETEROSCOPY BMI    Body Mass Index: 20.67 kg/m     Reproductive/Obstetrics negative OB ROS                             Anesthesia Physical Anesthesia Plan  ASA: 3  Anesthesia Plan: General   Post-op Pain Management:    Induction:   PONV Risk Score and Plan:   Airway Management Planned:   Additional Equipment:   Intra-op Plan:   Post-operative Plan:   Informed Consent: I have reviewed the patients History and Physical, chart, labs and discussed the procedure including the risks, benefits and alternatives for the proposed anesthesia with the patient or authorized representative who has indicated his/her understanding and acceptance.     Dental Advisory Given  Plan Discussed with: CRNA  Anesthesia Plan Comments:        Anesthesia Quick Evaluation

## 2023-01-09 NOTE — H&P (Signed)
Urology H&P   History of Present Illness: Leonard Trujillo is a 72 y.o. male status post left ureteroscopy laser lithotripsy of an 18 mm mid infundibular calculus extending into the renal pelvis.  Follow-up KUB with lower pole calcifications.  Difficult to tell if this is a significant fragment or residual minute particles.  He presents for second look ureteroscopy.  Past Medical History:  Diagnosis Date   Atherosclerosis of aorta (HCC)    Atherosclerosis of native coronary artery of native heart without angina pectoris    Basal cell carcinoma 01/18/2022   Right Lower Cheek above Jaw, MOHs 06/06/22   BPH (benign prostatic hyperplasia)    Diabetes mellitus without complication (HCC)    Family history of adverse reaction to anesthesia    Mother PONV   Glaucoma    History of kidney stones    Insomnia    Nephrolithiasis    PONV (postoperative nausea and vomiting)    severe   Pure hypercholesterolemia     Past Surgical History:  Procedure Laterality Date   CYSTOSCOPY/URETEROSCOPY/HOLMIUM LASER/STENT PLACEMENT Left 12/26/2022   Procedure: CYSTOSCOPY/URETEROSCOPY/HOLMIUM LASER/STENT PLACEMENT;  Surgeon: Riki Altes, MD;  Location: [facility] OR[facility];  Service: Urology;  Laterality: Left;   EYE SURGERY  2015   cataract with lens   MOHS SURGERY     rt cheek   Shockwave lithotripsy  2017   URETEROSCOPY      Home Medications:  Current Meds  Medication Sig   acetaminophen (TYLENOL) 500 MG tablet Take 500 mg by mouth every 6 (six) hours as needed.   ALPRAZolam (XANAX) 0.5 MG tablet Take 0.5 mg by mouth at bedtime as needed.   atorvastatin (LIPITOR) 10 MG tablet Take 10 mg by mouth daily with lunch.   bimatoprost (LUMIGAN) 0.01 % SOLN Place 1 drop into both eyes at bedtime.   CALCIUM-VITAMIN D PO Take by mouth daily.   cyanocobalamin (VITAMIN B12) 1000 MCG tablet Take by mouth.   dorzolamide-timolol (COSOPT) 22.3-6.8 MG/ML ophthalmic solution Administer 1 drop to both eyes Two (2) times a  day.   finasteride (PROSCAR) 5 MG tablet Take 1 tablet (5 mg total) by mouth daily.   Multiple Vitamins-Minerals (MENS 50+ MULTIVITAMIN PO) Take by mouth daily.   oxybutynin (DITROPAN) 5 MG tablet 1 tab tid prn frequency,urgency, bladder spasm   polyethylene glycol powder (GLYCOLAX/MIRALAX) 17 GM/SCOOP powder Take 1 Container by mouth as needed for mild constipation.   tamsulosin (FLOMAX) 0.4 MG CAPS capsule Take 1 capsule (0.4 mg total) by mouth daily after breakfast.    Allergies:  Allergies  Allergen Reactions   Fentanyl Nausea Only   Pioglitazone Other (See Comments)   Codeine Nausea And Vomiting    History reviewed. No pertinent family history.  Social History:  reports that he has never smoked. He has quit using smokeless tobacco. He reports that he does not drink alcohol and does not use drugs.  ROS: No fever, chills, shortness of breath  Physical Exam:  Vital signs in last 24 hours: Temp:  [97.1 F (36.2 C)] 97.1 F (36.2 C) (09/24 0712) Pulse Rate:  [60] 60 (09/24 0721) Resp:  [18] 18 (09/24 0721) BP: (137)/(86) 137/86 (09/24 0721) SpO2:  [100 %] 100 % (09/24 0721) Weight:  [63.5 kg] 63.5 kg (09/24 0721) Constitutional:  Alert and oriented, No acute distress HEENT: [place] AT, moist mucus membranes.  Trachea midline, no masses Respiratory: Normal respiratory effort, lungs clear bilaterally  Laboratory Data:  No results for input(s): "WBC", "HGB", "  HCT" in the last 72 hours. No results for input(s): "NA", "K", "CL", "CO2", "GLUCOSE", "BUN", "CREATININE", "CALCIUM" in the last 72 hours. No results for input(s): "LABPT", "INR" in the last 72 hours. No results for input(s): "LABURIN" in the last 72 hours. Results for orders placed or performed in visit on 12/13/22  CULTURE, URINE COMPREHENSIVE     Status: None   Collection Time: 12/13/22  9:41 AM   Specimen: Urine   UR  Result Value Ref Range Status   Urine Culture, Comprehensive Final report  Final   Organism ID,  Bacteria Comment  Final    Comment: No growth in 36 - 48 hours.  Microscopic Examination     Status: Abnormal   Collection Time: 12/13/22  9:41 AM   Urine  Result Value Ref Range Status   WBC, UA 11-30 (A) 0 - 5 /hpf Final   RBC, Urine 3-10 (A) 0 - 2 /hpf Final   Epithelial Cells (non renal) 0-10 0 - 10 /hpf Final   Crystals Present (A) N/A Final   Crystal Type Calcium Oxalate N/A Final   Mucus, UA Present (A) Not Estab. Final   Bacteria, UA Moderate (A) None seen/Few Final    Impression/Assessment:  Left nephrolithiasis  Plan:  Second look left ureteroscopy with possible laser lithotripsy/stone removal The procedure was discussed in detail include potential risks of bleeding, infection and renal injury.  All questions were answered and he desires to proceed    01/09/2023, 7:37 AM  Irineo Axon,  MD

## 2023-01-09 NOTE — Op Note (Signed)
Preoperative diagnosis: Left nephrolithiasis   Postoperative diagnosis: Left nephrolithiasis  Procedure:  Cystoscopy Left ureteroscopy and stone removal Ureteroscopic laser lithotripsy Left ureteral stent placement (71F/26 cm)  Left retrograde pyelography with interpretation  Surgeon: Lorin Picket C. Erie Sica, M.D.  Anesthesia: General  Complications: None  Intraoperative findings:  Cystoscopy: Urethra normal in caliber without stricture; TUR changes with right lateral lobe regrowth proximally and left lateral lobe regrowth distally; mild inflammatory changes left UO secondary to indwelling stent Ureteropyeloscopy: Patent ureter without stone fragments.  Lower pole calyces with several remaining fragments estimated 2-3 mm in size.  ~ 5 mm holmium laser fiber tip noted in the lower pole calyx Retrograde pyelogram post procedure showed no filling defects, stone fragments or contrast extravasation  EBL: Minimal  Specimens: None  Indication: Leonard Trujillo is a 72 y.o. male status post left ureteroscopy with laser lithotripsy 12/26/2022 for an 18 mm infundibular calculus extending into the renal pelvis.  Postprocedural KUB with residual fragments lower pole calyx and difficult to tell if large fragments or settling of minute stone particles.  He presents for second look ureteroscopy. After reviewing the management options for treatment, the patient elected to proceed with the above surgical procedure(s). We have discussed the potential benefits and risks of the procedure, side effects of the proposed treatment, the likelihood of the patient achieving the goals of the procedure, and any potential problems that might occur during the procedure or recuperation. Informed consent has been obtained.  Description of procedure:  The patient was taken to the operating room and general anesthesia was induced.  The patient was placed in the dorsal lithotomy position, prepped and draped in the usual sterile  fashion, and preoperative antibiotics were administered. A preoperative time-out was performed.   A 21 French cystoscope was lubricated, placed per urethra and passed proximally under direct vision into the bladder with findings as described above.    The left ureteral stent was grasped with endoscopic forceps and brought out through the urethral meatus.  A 0.038 Sensor wire was then placed the stent and advanced into the kidney under fluoroscopic guidance without difficulty.  A single channel digital flexible ureteroscope was passed per urethra and advanced into the bladder.  The ureteroscope was easily advanced into the ureter alongside the guidewire with findings as described above.  The laser fiber tip was placed in a 1.9 [place] nitinol basket and removed.  The ureteroscope was repassed.  Retrograde pyelogram was performed to the ureteroscope and all calyces were examined.    A small fragment in a midpole calyx was dusted with a 200 m Moses holmium laser fiber.  The lower calyceal fragments were treated with noncontact laser lithotripsy at a setting of 0.3J/80 Hz.  All calyces were then reexamined and no fragments larger than the tip of the laser fiber were identified.  Repeat retrograde pyelogram was then performed and no contrast extravasation was noted.  A 6 FR/26 CM Contour ureteral stent was placed under fluoroscopic guidance.  The wire was then removed with an adequate stent curl noted in the upper pole calyx as well as in the bladder.  The bladder was then emptied and the procedure ended.  The patient appeared to tolerate the procedure well and without complications.  After anesthetic reversal the patient was transported to the PACU in stable condition.  Plan: The stent was left attached to a tether which was secured to the dorsal aspect of the penis with Mastisol and a Tegaderm.  He was instructed  to remove the stent on Friday, 01/12/2023   Irineo Axon, MD

## 2023-01-09 NOTE — Interval H&P Note (Signed)
History and Physical Interval Note:  01/09/2023 8:44 AM  Leonard Trujillo  has presented today for surgery, with the diagnosis of Left Nephrolithiasis.  The various methods of treatment have been discussed with the patient and family. After consideration of risks, benefits and other options for treatment, the patient has consented to  Procedure(s): CYSTOSCOPY/URETEROSCOPY/HOLMIUM LASER/STENT EXCHANGE (Left) as a surgical intervention.  The patient's history has been reviewed, patient examined, no change in status, stable for surgery.  I have reviewed the patient's chart and labs.  Questions were answered to the patient's satisfaction.    CV: RRR Lungs: Clear  Marilyn Wing C Alexander Mcauley

## 2023-01-17 ENCOUNTER — Telehealth: Payer: Self-pay

## 2023-01-17 NOTE — Telephone Encounter (Signed)
Message left on triage line-   Pts wife Jasmine December states that he has been backed up since surgery. He took magnesium citrate on Sunday and has had diarrhea ever since. He is having blood when he wipes and his very raw. Stent was removed 9/27. No issues urinating.  + pushing fluids Stools are oily  Trying to eat NO fever NO pain Pt states he is very tired.   Advised BRAT diet. Use Vaseline or destin on bottom. Push fluids. Pts wife voiced understanding.

## 2023-01-18 ENCOUNTER — Encounter: Payer: Medicare HMO | Admitting: Urology

## 2023-01-22 NOTE — Anesthesia Postprocedure Evaluation (Signed)
Anesthesia Post Note  Patient: Leonard Trujillo  Procedure(s) Performed: CYSTOSCOPY/URETEROSCOPY/HOLMIUM LASER/STENT EXCHANGE (Left: Penis)  Patient location during evaluation: PACU Anesthesia Type: General Level of consciousness: awake and alert Pain management: pain level controlled Vital Signs Assessment: post-procedure vital signs reviewed and stable Respiratory status: spontaneous breathing, nonlabored ventilation, respiratory function stable and patient connected to nasal cannula oxygen Cardiovascular status: blood pressure returned to baseline and stable Postop Assessment: no apparent nausea or vomiting Anesthetic complications: no   No notable events documented.   Last Vitals:  Vitals:   01/09/23 1100 01/09/23 1141  BP: (!) 142/79 123/79  Pulse: 60 (!) 59  Resp: 18 18  Temp: (!) 36.4 C   SpO2: 100% 99%    Last Pain:  Vitals:   01/10/23 1017  TempSrc:   PainSc: 3                  Yevette Edwards

## 2023-02-22 ENCOUNTER — Other Ambulatory Visit: Payer: Self-pay | Admitting: *Deleted

## 2023-02-22 DIAGNOSIS — N200 Calculus of kidney: Secondary | ICD-10-CM

## 2023-02-26 ENCOUNTER — Encounter: Payer: Self-pay | Admitting: Urology

## 2023-02-26 ENCOUNTER — Ambulatory Visit: Payer: Medicare HMO | Admitting: Urology

## 2023-02-26 ENCOUNTER — Ambulatory Visit
Admission: RE | Admit: 2023-02-26 | Discharge: 2023-02-26 | Disposition: A | Discharge status: Home / Self Care | Payer: Medicare HMO | Source: Ambulatory Visit | Attending: Urology | Admitting: Urology

## 2023-02-26 ENCOUNTER — Ambulatory Visit
Admission: RE | Admit: 2023-02-26 | Discharge: 2023-02-26 | Disposition: A | Discharge status: Home / Self Care | Payer: Medicare HMO | Attending: Urology | Admitting: Urology

## 2023-02-26 VITALS — BP 136/84 | HR 69 | Ht 69.0 in | Wt 129.0 lb

## 2023-02-26 DIAGNOSIS — N200 Calculus of kidney: Secondary | ICD-10-CM | POA: Diagnosis present

## 2023-02-26 NOTE — Progress Notes (Signed)
I, Maysun Anabel Bene, acting as a scribe for Riki Altes, MD., have documented all relevant documentation on the behalf of Riki Altes, MD, as directed by Riki Altes, MD while in the presence of Riki Altes, MD.  02/26/2023 10:56 AM   Leonard Trujillo [birth year] [id number]  Referring provider: Lynnea Ferrier, MD [address] [facility] [place],  [place] [address]  Chief Complaint  Patient presents with   Nephrolithiasis   Urologic history: 1. Left nephrolithasis CT urogram remarkable for a 13 x 9 x 15 mm non-obstructing infundibular stone, approaching the left renal pelvis  HPI: Leonard Trujillo is a 72 y.o. male presents for post-op follow-up.  Status post staged ureteroscopy 9/10 and 01/09/2023 for left nephrolithiasis. Remaining stone fragments were cleared at his follow-up ureteroscopy.  He did develop C. diff post-op and was treated by PCP with in-office IV fluids and antibiotics and is currently improving. His only urologic medication he is currently taking is finasteride.    PMH: Past Medical History:  Diagnosis Date   Atherosclerosis of aorta (HCC)    Atherosclerosis of native coronary artery of native heart without angina pectoris    Basal cell carcinoma 01/18/2022   Right Lower Cheek above Jaw, MOHs 06/06/22   BPH (benign prostatic hyperplasia)    Diabetes mellitus without complication (HCC)    Family history of adverse reaction to anesthesia    Mother PONV   Glaucoma    History of kidney stones    Insomnia    Nephrolithiasis    PONV (postoperative nausea and vomiting)    severe   Pure hypercholesterolemia     Surgical History: Past Surgical History:  Procedure Laterality Date   CYSTOSCOPY/URETEROSCOPY/HOLMIUM LASER/STENT PLACEMENT Left 12/26/2022   Procedure: CYSTOSCOPY/URETEROSCOPY/HOLMIUM LASER/STENT PLACEMENT;  Surgeon: Riki Altes, MD;  Location: [facility];  Service: Urology;  Laterality: Left;    CYSTOSCOPY/URETEROSCOPY/HOLMIUM LASER/STENT PLACEMENT Left 01/09/2023   Procedure: CYSTOSCOPY/URETEROSCOPY/HOLMIUM LASER/STENT EXCHANGE;  Surgeon: Riki Altes, MD;  Location: [facility];  Service: Urology;  Laterality: Left;   EYE SURGERY  2015   cataract with lens   MOHS SURGERY     rt cheek   Shockwave lithotripsy  2017   URETEROSCOPY      Home Medications:  Allergies as of 02/26/2023       Reactions   Fentanyl Nausea Only   Pioglitazone Other (See Comments)   Codeine Nausea And Vomiting        Medication List        Accurate as of February 26, 2023 10:56 AM. If you have any questions, ask your nurse or doctor.          STOP taking these medications    HYDROcodone-acetaminophen 5-325 MG tablet Commonly known as: NORCO/VICODIN Stopped by: Riki Altes       TAKE these medications    acetaminophen 500 MG tablet Commonly known as: TYLENOL Take 500 mg by mouth every 6 (six) hours as needed.   ALPRAZolam 0.5 MG tablet Commonly known as: XANAX Take 0.5 mg by mouth at bedtime as needed.   atorvastatin 10 MG tablet Commonly known as: LIPITOR Take 10 mg by mouth daily with lunch.   CALCIUM-VITAMIN D PO Take by mouth daily.   cyanocobalamin 1000 MCG tablet Commonly known as: VITAMIN B12 Take by mouth.   docusate sodium 100 MG capsule Commonly known as: COLACE Take 100 mg by mouth 2 (two) times daily.   dorzolamide-timolol  2-0.5 % ophthalmic solution Commonly known as: COSOPT Administer 1 drop to both eyes Two (2) times a day.   finasteride 5 MG tablet Commonly known as: PROSCAR Take 1 tablet (5 mg total) by mouth daily.   Lumigan 0.01 % Soln Generic drug: bimatoprost Place 1 drop into both eyes at bedtime.   MENS 50+ MULTIVITAMIN PO Take by mouth daily.   metFORMIN 500 MG tablet Commonly known as: GLUCOPHAGE 500 mg 3 (three) times daily.   ondansetron 4 MG tablet Commonly known as: Zofran Take 1 tablet (4 mg total) by mouth every 8  (eight) hours as needed for nausea or vomiting.   oxybutynin 5 MG tablet Commonly known as: DITROPAN 1 tab tid prn frequency,urgency, bladder spasm   polyethylene glycol powder 17 GM/SCOOP powder Commonly known as: GLYCOLAX/MIRALAX Take 1 Container by mouth as needed for mild constipation.   tamsulosin 0.4 MG Caps capsule Commonly known as: FLOMAX Take 1 capsule (0.4 mg total) by mouth daily after breakfast.        Allergies:  Allergies  Allergen Reactions   Fentanyl Nausea Only   Pioglitazone Other (See Comments)   Codeine Nausea And Vomiting    Social History:  reports that he has never smoked. He has quit using smokeless tobacco. He reports that he does not drink alcohol and does not use drugs.   Physical Exam: BP 136/84   Pulse 69   Ht 5\' 9"  (1.753 m)   Wt 129 lb (58.5 kg)   BMI 19.05 kg/m   Constitutional:  Alert and oriented, No acute distress. HEENT: [place] AT, moist mucus membranes.  Trachea midline, no masses. Cardiovascular: No clubbing, cyanosis, or edema. Respiratory: Normal respiratory effort, no increased work of breathing. GI: Abdomen is soft, nontender, nondistended, no abdominal masses Skin: No rashes, bruises or suspicious lesions. Neurologic: Grossly intact, no focal deficits, moving all 4 extremities. Psychiatric: Normal mood and affect.   Pertinent Imaging: KUB performed today was personally reviewed and interpreted. No calcifications are seen overlying the left renal outline or expected course of the ureter. There are small, upper and lower renal calculi present.    Assessment & Plan:    1. Nephrolithiasis Doing well No evidence of residual stone fragments on today's KUB.  1 year follow-up with KUB.  [facility] [address], [address] [place], [place] [address] [phone]

## 2023-02-28 ENCOUNTER — Encounter: Payer: Self-pay | Admitting: Urology

## 2023-10-22 ENCOUNTER — Other Ambulatory Visit: Payer: Self-pay | Admitting: Urology

## 2024-02-14 ENCOUNTER — Other Ambulatory Visit: Payer: Self-pay | Admitting: *Deleted

## 2024-02-14 DIAGNOSIS — N200 Calculus of kidney: Secondary | ICD-10-CM

## 2024-02-27 ENCOUNTER — Ambulatory Visit: Payer: Self-pay | Admitting: Urology

## 2024-04-01 ENCOUNTER — Ambulatory Visit: Admission: RE | Admit: 2024-04-01 | Discharge: 2024-04-01 | Disposition: A | Attending: Urology | Admitting: Urology

## 2024-04-01 ENCOUNTER — Encounter: Payer: Self-pay | Admitting: Urology

## 2024-04-01 ENCOUNTER — Ambulatory Visit: Admission: RE | Admit: 2024-04-01 | Source: Ambulatory Visit

## 2024-04-01 ENCOUNTER — Ambulatory Visit: Admitting: Urology

## 2024-04-01 VITALS — BP 146/88 | HR 60 | Ht 69.0 in | Wt 145.0 lb

## 2024-04-01 DIAGNOSIS — N400 Enlarged prostate without lower urinary tract symptoms: Secondary | ICD-10-CM

## 2024-04-01 DIAGNOSIS — N200 Calculus of kidney: Secondary | ICD-10-CM | POA: Diagnosis present

## 2024-04-01 NOTE — Progress Notes (Signed)
 04/01/2024 10:21 AM   Dale JAYSON Ernst Aug 22, 1950 [id number]  Referring provider: Fernande Ophelia JINNY DOUGLAS, MD [address] [facility] [place],  [place] [address]  Chief Complaint  Patient presents with   Nephrolithiasis   Urologic history: 1.  Nephrolithiasis Staged ureteroscopic removal of a left 13 x 9 x 15 mm infundibular/renal pelvic stone 12/2022 Nonobstructing right renal calculi  2.  BPH without LUTS Prior PVP 07/2010 Finasteride  5 mg daily started 2023 for adenoma regrowth on cystoscopy   HPI: Leonard Trujillo is a 73 y.o. male who presents for annual follow-up.    No complaints since last visit.   Denies flank, abdominal or pelvic pain No bothersome LUTS PSA 11/06/2023 was 0.96 (uncorrected)   PMH: Past Medical History:  Diagnosis Date   Atherosclerosis of aorta    Atherosclerosis of native coronary artery of native heart without angina pectoris    Basal cell carcinoma 01/18/2022   Right Lower Cheek above Jaw, MOHs 06/06/22   BPH (benign prostatic hyperplasia)    Diabetes mellitus without complication (HCC)    Family history of adverse reaction to anesthesia    Mother PONV   Glaucoma    History of kidney stones    Insomnia    Nephrolithiasis    PONV (postoperative nausea and vomiting)    severe   Pure hypercholesterolemia     Surgical History: Past Surgical History:  Procedure Laterality Date   CYSTOSCOPY/URETEROSCOPY/HOLMIUM LASER/STENT PLACEMENT Left 12/26/2022   Procedure: CYSTOSCOPY/URETEROSCOPY/HOLMIUM LASER/STENT PLACEMENT;  Surgeon: Twylla Glendia JAYSON, MD;  Location: [facility];  Service: Urology;  Laterality: Left;   CYSTOSCOPY/URETEROSCOPY/HOLMIUM LASER/STENT PLACEMENT Left 01/09/2023   Procedure: CYSTOSCOPY/URETEROSCOPY/HOLMIUM LASER/STENT EXCHANGE;  Surgeon: Twylla Glendia JAYSON, MD;  Location: [facility];  Service: Urology;  Laterality: Left;   EYE SURGERY  2015   cataract with lens   MOHS SURGERY     rt cheek   Shockwave lithotripsy  2017    URETEROSCOPY      Home Medications:  Allergies as of 04/01/2024       Reactions   Fentanyl  Nausea Only   Pioglitazone Other (See Comments)   Codeine Nausea And Vomiting        Medication List        Accurate as of April 01, 2024 10:21 AM. If you have any questions, ask your nurse or doctor.          STOP taking these medications    ondansetron  4 MG tablet Commonly known as: Zofran  Stopped by: Glendia Twylla, MD   polyethylene glycol powder 17 GM/SCOOP powder Commonly known as: GLYCOLAX/MIRALAX Stopped by: Glendia Twylla, MD   tamsulosin  0.4 MG Caps capsule Commonly known as: FLOMAX  Stopped by: Glendia Twylla, MD       TAKE these medications    acetaminophen  500 MG tablet Commonly known as: TYLENOL  Take 500 mg by mouth every 6 (six) hours as needed.   ALPRAZolam 0.5 MG tablet Commonly known as: XANAX Take 0.5 mg by mouth at bedtime as needed.   atorvastatin 10 MG tablet Commonly known as: LIPITOR Take 10 mg by mouth daily with lunch.   CALCIUM-VITAMIN D PO Take by mouth daily.   cyanocobalamin 1000 MCG tablet Commonly known as: VITAMIN B12 Take by mouth.   docusate sodium 100 MG capsule Commonly known as: COLACE Take 100 mg by mouth 2 (two) times daily.   dorzolamide-timolol 2-0.5 % ophthalmic solution Commonly known as: COSOPT Administer 1 drop to both eyes Two (2) times a  day.   finasteride  5 MG tablet Commonly known as: PROSCAR  TAKE 1 TABLET (5 MG TOTAL) BY MOUTH DAILY.   Lumigan 0.01 % Soln Generic drug: bimatoprost Place 1 drop into both eyes at bedtime.   MENS 50+ MULTIVITAMIN PO Take by mouth daily.   metFORMIN 500 MG tablet Commonly known as: GLUCOPHAGE 500 mg 3 (three) times daily.   oxybutynin  5 MG tablet Commonly known as: DITROPAN  1 tab tid prn frequency,urgency, bladder spasm   Vyzulta 0.024 % Soln Generic drug: Latanoprostene Bunod Apply to eye.        Allergies: Allergies[1]  Family History: No family  history on file.  Social History:  reports that he has never smoked. He has quit using smokeless tobacco. He reports that he does not drink alcohol and does not use drugs.   Physical Exam: BP (!) 146/88   Pulse 60   Ht 5' 9 (1.753 m)   Wt 145 lb (65.8 kg)   BMI 21.41 kg/m   Constitutional:  Alert, No acute distress. HEENT: [facility] AT Respiratory: Normal respiratory effort, no increased work of breathing. Psychiatric: Normal mood and affect.   Pertinent Imaging: A KUB performed prior to this morning's visit was personally reviewed and interpreted.  No left-sided calcifications identified.  7 mm calcification overlying the right lower pole which is unchanged  Assessment & Plan:    1.  Right nephrolithiasis Nonobstructing calculus, asymptomatic 1 year follow-up with KUB  2.  BPH without LUTS Continue finasteride    Glendia JAYSON Barba, MD  [facility] [address] [place], [place] [address] [phone]    [1]  Allergies Allergen Reactions   Fentanyl  Nausea Only   Pioglitazone Other (See Comments)   Codeine Nausea And Vomiting

## 2025-04-01 ENCOUNTER — Ambulatory Visit: Admitting: Urology
